# Patient Record
Sex: Female | Born: 1965 | Race: White | Hispanic: No | Marital: Married | Smoking: Never smoker
Health system: Southern US, Community
[De-identification: ages and names within clinical notes are randomized; demographics above are authoritative.]

## PROBLEM LIST (undated history)

## (undated) DIAGNOSIS — K635 Polyp of colon: Secondary | ICD-10-CM

## (undated) DIAGNOSIS — J302 Other seasonal allergic rhinitis: Secondary | ICD-10-CM

## (undated) DIAGNOSIS — N6320 Unspecified lump in the left breast, unspecified quadrant: Secondary | ICD-10-CM

## (undated) DIAGNOSIS — K219 Gastro-esophageal reflux disease without esophagitis: Secondary | ICD-10-CM

## (undated) DIAGNOSIS — K5790 Diverticulosis of intestine, part unspecified, without perforation or abscess without bleeding: Secondary | ICD-10-CM

## (undated) DIAGNOSIS — H9190 Unspecified hearing loss, unspecified ear: Secondary | ICD-10-CM

## (undated) DIAGNOSIS — N3010 Interstitial cystitis (chronic) without hematuria: Secondary | ICD-10-CM

## (undated) DIAGNOSIS — J320 Chronic maxillary sinusitis: Secondary | ICD-10-CM

## (undated) DIAGNOSIS — F32A Depression, unspecified: Secondary | ICD-10-CM

## (undated) DIAGNOSIS — Z8659 Personal history of other mental and behavioral disorders: Secondary | ICD-10-CM

## (undated) DIAGNOSIS — N390 Urinary tract infection, site not specified: Secondary | ICD-10-CM

## (undated) DIAGNOSIS — K5792 Diverticulitis of intestine, part unspecified, without perforation or abscess without bleeding: Secondary | ICD-10-CM

## (undated) DIAGNOSIS — R064 Hyperventilation: Secondary | ICD-10-CM

## (undated) DIAGNOSIS — G43909 Migraine, unspecified, not intractable, without status migrainosus: Secondary | ICD-10-CM

## (undated) DIAGNOSIS — K589 Irritable bowel syndrome without diarrhea: Secondary | ICD-10-CM

## (undated) DIAGNOSIS — F329 Major depressive disorder, single episode, unspecified: Secondary | ICD-10-CM

## (undated) DIAGNOSIS — K5900 Constipation, unspecified: Secondary | ICD-10-CM

## (undated) DIAGNOSIS — K649 Unspecified hemorrhoids: Secondary | ICD-10-CM

## (undated) DIAGNOSIS — A499 Bacterial infection, unspecified: Secondary | ICD-10-CM

## (undated) DIAGNOSIS — R079 Chest pain, unspecified: Secondary | ICD-10-CM

## (undated) DIAGNOSIS — R109 Unspecified abdominal pain: Secondary | ICD-10-CM

## (undated) HISTORY — DX: Interstitial cystitis (chronic) without hematuria: N30.10

## (undated) HISTORY — PX: VAGINAL HYSTERECTOMY: SUR661

## (undated) HISTORY — DX: Chest pain, unspecified: R07.9

## (undated) HISTORY — DX: Diverticulosis of intestine, part unspecified, without perforation or abscess without bleeding: K57.90

## (undated) HISTORY — DX: Migraine, unspecified, not intractable, without status migrainosus: G43.909

## (undated) HISTORY — DX: Depression, unspecified: F32.A

## (undated) HISTORY — PX: LAPAROSCOPY: SHX197

## (undated) HISTORY — DX: Irritable bowel syndrome, unspecified: K58.9

## (undated) HISTORY — DX: Unspecified hemorrhoids: K64.9

## (undated) HISTORY — DX: Unspecified abdominal pain: R10.9

## (undated) HISTORY — DX: Major depressive disorder, single episode, unspecified: F32.9

## (undated) HISTORY — DX: Urinary tract infection, site not specified: N39.0

## (undated) HISTORY — DX: Hyperventilation: R06.4

## (undated) HISTORY — PX: SALPINGECTOMY: SHX328

## (undated) HISTORY — DX: Polyp of colon: K63.5

## (undated) HISTORY — PX: TUBAL LIGATION: SHX77

## (undated) HISTORY — DX: Bacterial infection, unspecified: A49.9

## (undated) HISTORY — DX: Unspecified hearing loss, unspecified ear: H91.90

## (undated) HISTORY — DX: Diverticulitis of intestine, part unspecified, without perforation or abscess without bleeding: K57.92

## (undated) HISTORY — DX: Constipation, unspecified: K59.00

## (undated) HISTORY — DX: Personal history of other mental and behavioral disorders: Z86.59

## (undated) HISTORY — PX: SPINAL FUSION: SHX223

## (undated) HISTORY — DX: Gastro-esophageal reflux disease without esophagitis: K21.9

## (undated) HISTORY — DX: Other seasonal allergic rhinitis: J30.2

## (undated) HISTORY — DX: Chronic maxillary sinusitis: J32.0

---

## 2005-06-11 ENCOUNTER — Encounter: Admission: RE | Admit: 2005-06-11 | Discharge: 2005-09-09 | Payer: Self-pay | Admitting: Neurosurgery

## 2005-07-26 ENCOUNTER — Encounter: Admission: RE | Admit: 2005-07-26 | Discharge: 2005-07-26 | Payer: Self-pay | Admitting: Neurosurgery

## 2005-12-14 ENCOUNTER — Encounter: Payer: Self-pay | Admitting: Cardiology

## 2006-12-08 ENCOUNTER — Inpatient Hospital Stay (HOSPITAL_COMMUNITY): Admission: RE | Admit: 2006-12-08 | Discharge: 2006-12-11 | Payer: Self-pay | Admitting: Orthopaedic Surgery

## 2007-10-23 ENCOUNTER — Ambulatory Visit: Payer: Self-pay | Admitting: Gastroenterology

## 2007-10-23 DIAGNOSIS — K625 Hemorrhage of anus and rectum: Secondary | ICD-10-CM

## 2007-10-24 ENCOUNTER — Ambulatory Visit: Payer: Self-pay | Admitting: Gastroenterology

## 2007-10-24 ENCOUNTER — Telehealth: Payer: Self-pay | Admitting: Gastroenterology

## 2007-10-24 ENCOUNTER — Encounter: Payer: Self-pay | Admitting: Gastroenterology

## 2007-10-24 LAB — CONVERTED CEMR LAB
BUN: 21 mg/dL (ref 6–23)
Basophils Relative: 0.9 % (ref 0.0–3.0)
CO2: 25 meq/L (ref 19–32)
Calcium: 9.6 mg/dL (ref 8.4–10.5)
Chloride: 106 meq/L (ref 96–112)
Creatinine, Ser: 0.9 mg/dL (ref 0.4–1.2)
Eosinophils Relative: 2.3 % (ref 0.0–5.0)
GFR calc Af Amer: 88 mL/min
GFR calc non Af Amer: 73 mL/min
Glucose, Bld: 89 mg/dL (ref 70–99)
Hemoglobin: 14.5 g/dL (ref 12.0–15.0)
IgA: 180 mg/dL (ref 68–378)
Lymphocytes Relative: 33.3 % (ref 12.0–46.0)
MCHC: 34.4 g/dL (ref 30.0–36.0)
Monocytes Relative: 8.3 % (ref 3.0–12.0)
Neutro Abs: 3.1 10*3/uL (ref 1.4–7.7)
RBC: 4.43 M/uL (ref 3.87–5.11)
TSH: 1.75 microintl units/mL (ref 0.35–5.50)
Total Bilirubin: 0.8 mg/dL (ref 0.3–1.2)
WBC: 5.5 10*3/uL (ref 4.5–10.5)

## 2007-10-25 ENCOUNTER — Telehealth: Payer: Self-pay | Admitting: Gastroenterology

## 2007-10-26 ENCOUNTER — Encounter: Payer: Self-pay | Admitting: Gastroenterology

## 2008-05-08 ENCOUNTER — Ambulatory Visit: Payer: Self-pay | Admitting: Gastroenterology

## 2008-05-09 LAB — CONVERTED CEMR LAB
ALT: 16 units/L (ref 0–35)
Albumin: 3.9 g/dL (ref 3.5–5.2)
Total Protein: 7.3 g/dL (ref 6.0–8.3)

## 2008-05-14 ENCOUNTER — Telehealth: Payer: Self-pay | Admitting: Gastroenterology

## 2008-05-14 ENCOUNTER — Ambulatory Visit: Payer: Self-pay | Admitting: Gastroenterology

## 2008-05-14 DIAGNOSIS — R5381 Other malaise: Secondary | ICD-10-CM

## 2008-05-14 DIAGNOSIS — R141 Gas pain: Secondary | ICD-10-CM

## 2008-05-14 DIAGNOSIS — R1084 Generalized abdominal pain: Secondary | ICD-10-CM

## 2008-05-14 DIAGNOSIS — R143 Flatulence: Secondary | ICD-10-CM

## 2008-05-14 DIAGNOSIS — R5383 Other fatigue: Secondary | ICD-10-CM

## 2008-05-14 DIAGNOSIS — K219 Gastro-esophageal reflux disease without esophagitis: Secondary | ICD-10-CM

## 2008-05-14 DIAGNOSIS — R142 Eructation: Secondary | ICD-10-CM

## 2008-05-14 DIAGNOSIS — K589 Irritable bowel syndrome without diarrhea: Secondary | ICD-10-CM

## 2008-05-15 ENCOUNTER — Telehealth: Payer: Self-pay | Admitting: Nurse Practitioner

## 2008-05-15 DIAGNOSIS — K594 Anal spasm: Secondary | ICD-10-CM

## 2008-05-17 LAB — CONVERTED CEMR LAB
Basophils Absolute: 0 10*3/uL (ref 0.0–0.1)
Basophils Relative: 0.5 % (ref 0.0–3.0)
Hemoglobin: 14 g/dL (ref 12.0–15.0)
Lymphocytes Relative: 39.1 % (ref 12.0–46.0)
MCHC: 35.1 g/dL (ref 30.0–36.0)
Monocytes Relative: 6.2 % (ref 3.0–12.0)
Neutro Abs: 3.3 10*3/uL (ref 1.4–7.7)
Neutrophils Relative %: 52.3 % (ref 43.0–77.0)
RBC: 4.09 M/uL (ref 3.87–5.11)
RDW: 13 % (ref 11.5–14.6)
TSH: 1.41 microintl units/mL (ref 0.35–5.50)

## 2008-12-30 ENCOUNTER — Encounter: Payer: Self-pay | Admitting: Cardiology

## 2009-01-02 ENCOUNTER — Encounter: Payer: Self-pay | Admitting: Cardiology

## 2009-01-02 ENCOUNTER — Ambulatory Visit: Payer: Self-pay | Admitting: Cardiology

## 2009-01-02 DIAGNOSIS — R079 Chest pain, unspecified: Secondary | ICD-10-CM

## 2009-01-02 HISTORY — DX: Chest pain, unspecified: R07.9

## 2009-01-07 ENCOUNTER — Encounter: Payer: Self-pay | Admitting: Cardiology

## 2009-01-13 DIAGNOSIS — F411 Generalized anxiety disorder: Secondary | ICD-10-CM | POA: Insufficient documentation

## 2009-01-13 DIAGNOSIS — G43909 Migraine, unspecified, not intractable, without status migrainosus: Secondary | ICD-10-CM | POA: Insufficient documentation

## 2009-01-13 DIAGNOSIS — M549 Dorsalgia, unspecified: Secondary | ICD-10-CM | POA: Insufficient documentation

## 2009-01-14 ENCOUNTER — Encounter: Payer: Self-pay | Admitting: Physician Assistant

## 2009-01-14 ENCOUNTER — Encounter (INDEPENDENT_AMBULATORY_CARE_PROVIDER_SITE_OTHER): Payer: Self-pay | Admitting: *Deleted

## 2009-01-14 ENCOUNTER — Ambulatory Visit: Payer: Self-pay | Admitting: Cardiology

## 2009-01-14 DIAGNOSIS — R079 Chest pain, unspecified: Secondary | ICD-10-CM

## 2009-01-16 ENCOUNTER — Encounter (INDEPENDENT_AMBULATORY_CARE_PROVIDER_SITE_OTHER): Payer: Self-pay | Admitting: *Deleted

## 2009-01-16 ENCOUNTER — Encounter: Payer: Self-pay | Admitting: Cardiology

## 2009-01-21 ENCOUNTER — Inpatient Hospital Stay (HOSPITAL_BASED_OUTPATIENT_CLINIC_OR_DEPARTMENT_OTHER): Admission: RE | Admit: 2009-01-21 | Discharge: 2009-01-21 | Payer: Self-pay | Admitting: Cardiovascular Disease

## 2009-01-21 ENCOUNTER — Ambulatory Visit: Payer: Self-pay | Admitting: Cardiovascular Disease

## 2010-01-05 ENCOUNTER — Ambulatory Visit (HOSPITAL_COMMUNITY)
Admission: RE | Admit: 2010-01-05 | Discharge: 2010-01-05 | Payer: Self-pay | Source: Home / Self Care | Admitting: Obstetrics and Gynecology

## 2010-01-16 ENCOUNTER — Ambulatory Visit (HOSPITAL_COMMUNITY)
Admission: RE | Admit: 2010-01-16 | Discharge: 2010-01-16 | Payer: Self-pay | Source: Home / Self Care | Admitting: Obstetrics and Gynecology

## 2010-04-19 ENCOUNTER — Encounter: Payer: Self-pay | Admitting: Orthopaedic Surgery

## 2010-06-11 LAB — SURGICAL PCR SCREEN: Staphylococcus aureus: NEGATIVE

## 2010-06-11 LAB — CBC
MCV: 96.6 fL (ref 78.0–100.0)
Platelets: 190 10*3/uL (ref 150–400)
RBC: 4.21 MIL/uL (ref 3.87–5.11)
RDW: 13.2 % (ref 11.5–15.5)
WBC: 6.4 10*3/uL (ref 4.0–10.5)

## 2010-08-11 NOTE — Discharge Summary (Signed)
NAMEJASLEEN, [name]              ACCOUNT NO.:  [id number]   MEDICAL RECORD NO.:  [id number]          PATIENT TYPE:  INP   LOCATION:  5010                         FACILITY:  [facility]   PHYSICIAN:  Sharolyn Douglas, M.D.        DATE OF BIRTH:  Jan 26, 1966   DATE OF ADMISSION:  12/08/2006  DATE OF DISCHARGE:                               DISCHARGE SUMMARY   ADMITTING DIAGNOSES:  1. 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  2. 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  3. Anxiety.  4. Asthma.   DISCHARGE DIAGNOSES:  1. Status post L4-5 posterior spinal fusion.  2. Postoperative blood loss anemia.  3. Postoperative hypokalemia.  4. 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  5. 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  6. Anxiety.  7. Asthma.   PROCEDURE:  On September 11, 2008m the patient was taken to the  operating room for L4-5 laminectomy and decompression, as well as  posterior spinal fusion with pedicle screws.   SURGEON:  Max Noel Gerold.   ASSISTANT:  Colleen Mahar PA-C.   ANESTHESIA:  General.   CONSULTANT:  None.   LABS:  Preoperatively, CBC with diff was within normal limits.  Postoperatively, CBC was monitored x2 days, and she did have mild anemia  but otherwise normal.  PT/INR and PTT pre-op were normal.  Complete  metabolic panel pre-op was normal.  Postoperatively, basic metabolic  panel.  She had elevated glucose at 136 and 113 on postoperative day 1  and 2.  Potassium was decreased at 3.4 on postoperative day #2,  otherwise basic metabolic panel was normal.  UA from preop was negative.  Blood typing was B+, antibody screen was negative.  Urine culture showed  no growth.  X-rays were used intraoperatively for localization.  EKG was  not seen on the chart.   BRIEF HISTORY:  The patient is a 45 year old female with chronic  persistent back pain and left lower extremity pain.  She has a large  disk herniation at L4-5, which was impinging the left L5 nerve root.  She has pain in her back and  does extend down the left leg.  Diskogram  did show concordant pain at L4-5 level.  Throat she unfortunately failed  all conservative treatments and was having continued severe pain that  was affecting her activities of daily living and quality of life.  Risks  and benefits of the surgery were discussed with the patient by Dr.  Noel Gerold.  She indicated understanding and opted to proceed.   HOSPITAL COURSE:  On December 08, 2006, the patient was taken to the  operating room for the above-listed procedure.  She tolerated the  procedure well without any intraoperative complications, and she was  transferred to the recovery room in stable condition.   Postoperatively, routine orthopedic spine protocol was followed.  She  progressed along very well.  She did not develop any significant medical  orthopedic complications postoperatively.   Physical therapy and occupational therapy worked with her on a daily  basis.  They worked on back precautions.  They also worked on brace use  and ambulation and  progressive ambulation program.  She did extremely  well with them, was ambulating excess of 300 feet by postoperative day  #2.  She was independent and safe with all the above, prior to  discharge.   By December 11, 2006, the patient had met all orthopedic goals.  She  was medically stable and ready for discharge home.   DISCHARGE/PLAN:  The patient is a 45 year old female status post  posterior spinal fusion doing well.   ACTIVITIES:  Daily ambulation program, brace on for activities, back  precautions at all times, may shower, no lifting greater than 5 pounds,  progressive ambulation program.  Follow-up 2 weeks postoperatively with  Dr. Noel Gerold.   DIET:  Regular home diet.   CONDITION ON DISCHARGE:  Stable, improved.   DISPOSITION:  The patient is being discharged to her home with her  family's assistance.  Family assistance, as well as home health physical  therapy and occupational  therapy.   MEDICATIONS:  Vicodin for pain, Robaxin for muscle spasm, calcium daily,  multivitamin daily, Colace twice daily, laxative as needed and resume  home medications.      Verlin Fester, P.A.      Sharolyn Douglas, M.D.  Electronically Signed    CM/MEDQ  D:  12/11/2006  T:  12/11/2006  Job:  [id number]   cc:   Sharolyn Douglas, M.D.

## 2010-08-11 NOTE — Op Note (Signed)
NAMEDEMETRIC, PARSLOW              ACCOUNT NO.:  [id number]   MEDICAL RECORD NO.:  [id number]          PATIENT TYPE:  INP   LOCATION:  2550                         FACILITY:  [facility]   PHYSICIAN:  Sharolyn Douglas, M.D.        DATE OF BIRTH:  09/28/1965   DATE OF PROCEDURE:  12/08/2006  DATE OF DISCHARGE:                               OPERATIVE REPORT   DIAGNOSES:  1. Lumbar degenerative disk disease L4-5 with chronic back and left      leg pain.  2. 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   PROCEDURES:  1. L4-5 laminectomy with decompression of disk herniation.  2. L4-5 transforaminal lumbar interbody fusion with placement of 10 mm      PEEK cage.  3. Pedicle screw instrumentation L4-5 using the Abbott spine system.  4. Posterior spinal fusion L4-5.  5. Local autogenous bone graft supplemented with 5 mL Grafton      allograft and OP-1 BMP.   SURGEON:  Sharolyn Douglas, MD.   ASSISTANTJill Side Mahar, PA.   ANESTHESIA:  General endotracheal.   ESTIMATED BLOOD LOSS:  300 mL.   COMPLICATIONS:  None.   Needle and sponge count correct and 70.   INDICATIONS:  The patient is a pleasant 45 year old female with chronic  persistent back and left lower extremity pain.  She has a large central  and left-sided disk herniation at L4-5 which displaces the left L5 nerve  root.  She has had a diskogram performed with concordant pain at low  pressure at the L4-5 level.  She has failed all attempts at other  conservative options and now elects to undergo L4-5 decompression and  fusion in hopes of improving her symptoms.  Risks, benefits and  alternatives were reviewed and the patient elected to proceed.   PROCEDURE:  After informed consent, she was taken to the operating room.  She underwent general endotracheal anesthesia without SSEPs.  She was  carefully turned prone onto the Wilson frame.  All bony prominences  padded.  Face and eyes protected at all times.  Back prepped and draped  in usual sterile fashion.  A  midline incision was made from L3 down to  L5.  Dissection was carried sharply through the deep fascia.  Subperiosteal exposure was carried out to the tips of the transverse  process of L4 and L5.  Intraoperative x-ray was taken to confirm the  levels.  Deep retractors were placed.  The approach was somewhat  difficult due to the patient's extreme lordosis.  Pedicle screws were  placed at L4 and L5 using anatomic probing technique.  Each pedicle  starting position was identified using the local anatomy.  The pedicles  were cannulated and then palpated.  There were no breeches.  Each  pedicle was then tapped.  The 6.5 x 45 mm screws were placed in L4 and  6.5 x 40 mm screws were placed at L5.  The screw purchase was good.  We  stimulated each screw using triggered EMGs and there were no deleterious  changes.   At this point, we turned our attention to performing  a laminectomy and  diskectomy.  The interspinous ligament and superior two thirds of the L5  spinous process were removed.  The ligamentum flavum was removed  piecemeal.  A partial facetectomy was completed on the left side.  The  lateral aspect of the thecal sac was identified and the L5 nerve root.  These structures were  then gently mobilized medially.  We identified a  large left-sided and central disk herniation which was displacing the  thecal sac and nerve root dorsally.  The disk space was entered and  using Epstein curettes.  The disk was decompressed back into the  interspace and a subtotal diskectomy was completed.  At this point, we  turned our attention to performing a transforaminal lumbar interbody  fusion in order to further decompress the nerve roots, remove the  painful disk and improve the fusion rate.  The remaining facet joint was  osteotomized.  The exiting and transversing nerve roots were identified  and protected at all times.  Free running EMGs were monitored.  The disk  space was entered and the  cartilaginous endplates were scraped clean  using the curettes and working through the transforaminal window.  Once  we were satisfied with the diskectomy and the disk space preparation,  the disk space was dilated up to 10 mm.  We then packed the disk space  with local bone graft obtained from the laminectomy.  A 10 mm PEEK cage  from Abbott Spine measuring 10 x 26 was packed with OP-1 BMP.  The cage  was placed into the interspace, tamped anteriorly, flipped  longitudinally within the interspace.  We had good distraction.  There  were no changes in the free running EMGs throughout the TLIF procedure.  We then completed posterior spinal fusion by decorticating the  transverse processes L4 and L5 bilaterally.  The remaining bone graft  was mixed with 5 mL of Grafton allograft and packed into the lateral  gutters over the transverse processes of L4 and L5.  We then placed 40  mm titanium rods into the polyaxial screw heads and gentle compression  was applied before shearing off the locking caps. Intraoperative x-rays  were taken which showed the instrumentation in appropriate position at  L4-5.  A deep Hemovac drain was left.  Meticulous hemostasis was  achieved.  The deep fascia was closed with running #1 Vicryl suture.  Subcutaneous layer closed with 0 Vicryl and 2-0 Vicryl followed by a  running 3-0 subcuticular Vicryl suture on the skin edges.  Dermabond was  applied.  Sterile dressing placed.  Patient was turned supine, extubated  without difficulty and transferred to recovery in stable condition.   My assistant, Verlin Fester, PA, was present throughout procedure.  She  assisted me with the positioning, she then helped me with the exposure  using the Cobb elevators and suction.  She worked Systems developer during the decompression, providing retraction  of the neural elements and suction.  She also assisted with the  arthrodesis and the instrumentation and  assisted with wound closure.      Sharolyn Douglas, M.D.  Electronically Signed     MC/MEDQ  D:  12/08/2006  T:  12/09/2006  Job:  [id number]

## 2010-08-11 NOTE — Assessment & Plan Note (Signed)
[facility]                                 ON-CALL NOTE   NAME:Griffin, Ellen                       MRN:          [id number]  DATE:10/23/2007                            DOB:          March 21, 1966    NOTE:  Telephone call; time 6 P.M.  Phone number is [phone]   The patient is beginning a colonoscopy prep tonight with Movi Prep.  She  states that she has only been able to drink one glass of the prep and  she started vomiting.  She strongly prefers proceeding with a pill  preparation.  I advised her that Osmoprep has a warning regarding kidney  damage and even kidney failure on rare occasions and I advised her I  would prefer to use another colon preparation of colon and antiemetic.  She states she understands the risks to Osmoprep and wishes to have that  preparation as she does not feel any other liquid oral preparation will  be tolerable.  I called her in a prescription for Osmoprep #32 with the  standard split dose preparation outlined to the patient over the phone  and encouraged her to push large volumes of clear fluid before, during  and after the evening and morning preparation.     Venita Lick. Russella Dar, MD, [facility]  Electronically Signed    MTS/MedQ  DD: 10/23/2007  DT: 10/24/2007  Job #: [id number]   cc:   Rachael Fee, MD

## 2010-11-23 ENCOUNTER — Ambulatory Visit (HOSPITAL_BASED_OUTPATIENT_CLINIC_OR_DEPARTMENT_OTHER)
Admission: RE | Admit: 2010-11-23 | Discharge: 2010-11-23 | Disposition: A | Discharge status: Home / Self Care | Payer: BC Managed Care – PPO | Source: Ambulatory Visit | Attending: Urology | Admitting: Urology

## 2010-11-23 DIAGNOSIS — IMO0002 Reserved for concepts with insufficient information to code with codable children: Secondary | ICD-10-CM | POA: Insufficient documentation

## 2010-11-23 DIAGNOSIS — N949 Unspecified condition associated with female genital organs and menstrual cycle: Secondary | ICD-10-CM | POA: Insufficient documentation

## 2010-11-23 DIAGNOSIS — R351 Nocturia: Secondary | ICD-10-CM | POA: Insufficient documentation

## 2010-11-23 DIAGNOSIS — Z01812 Encounter for preprocedural laboratory examination: Secondary | ICD-10-CM | POA: Insufficient documentation

## 2010-11-23 LAB — POCT HEMOGLOBIN-HEMACUE: Hemoglobin: 13.8 g/dL (ref 12.0–15.0)

## 2010-12-16 NOTE — Op Note (Signed)
  NAMEBENTLEY, HARALSON              ACCOUNT NO.:  [id number]  MEDICAL RECORD NO.:  [id number]  LOCATION:                                 FACILITY:  PHYSICIAN:  Valetta Fuller, MD    DATE OF BIRTH:  1965/12/22  DATE OF PROCEDURE:  11/23/2010 DATE OF DISCHARGE:                              OPERATIVE REPORT   PREOPERATIVE DIAGNOSES: 1. Pelvic pain. 2. Possible interstitial cystitis.  POSTOPERATIVE DIAGNOSIS: 1. Pelvic pain. 2. Possible interstitial cystitis.  PROCEDURE PERFORMED:  Cystoscopy with hydraulic overdistention of bladder and instillation of Marcaine.  SURGEON:  Valetta Fuller, MD  ANESTHESIA:  General.  INDICATIONS:  Ms. Lasure is 45 years of age.  She was sent to see me initially because of pelvic pain and questionable recurrent bacterial cystitis.  Based on history, it appeared that she had potentially a component of painful bladder syndrome/interstitial cystitis.  She has had constant problems with bladder pressure as well as some bladder overactivity.  She has nocturia 3-4 per evening and discomfort with intercourse.  We initiated her care with some behavioral modification and attempts at antispasmodics.  She was in recently to see one of our PAs and had ongoing symptoms.  Conservative therapy had not improved her situation.  We had previously talked to her about the possibility of cystoscopy with hydraulic overdistention of the bladder for both further diagnostic as well as potential therapeutic benefit.  She wanted to proceed with that and appeared to understand the advantages, disadvantages, potential complications, and risks.  She presents now for the procedure.  TECHNIQUE AND FINDINGS:  The patient was brought to the operating room where she had successful administration of general anesthesia.  She was placed in lithotomy position and prepped and draped in usual manner. She received perioperative ciprofloxacin.  Appropriate surgical time-out was  performed.  There were no obvious clinical abnormalities on vaginal exam.  Cystoscopy revealed unremarkable urethra and bladder.  The patient's bladder was hydraulically over distended to 1 meter of water pressure for 5 minutes.  Capacity was 1000 cc.  She had a few scant areas of some very minimal glomerular hemorrhaging, but overall endoscopically this was fairly unimpressive and there was no clear endoscopic evidence of interstitial cystitis.  No other abnormalities were appreciated.  We did instill 30 cc of Marcaine in her bladder.  We will ultimately wait to see if this procedure has any benefit for her.  If not, then we may not be dealing with interstitial cystitis given the current findings today.     Valetta Fuller, MD     DSG/MEDQ  D:  11/23/2010  T:  11/23/2010  Job:  [id number]  cc:   Guy Sandifer. Henderson Cloud, M.D. Fax: [phone]  Electronically Signed by Barron Alvine M.D. on 12/16/2010 09:30:15 AM

## 2011-01-08 LAB — COMPREHENSIVE METABOLIC PANEL
ALT: 16
AST: 21
Alkaline Phosphatase: 63
CO2: 29
Calcium: 9.8
Chloride: 105
GFR calc Af Amer: >60
GFR calc non Af Amer: >60
Glucose, Bld: 98
Potassium: 3.8
Sodium: 139
Total Bilirubin: 0.6

## 2011-01-08 LAB — URINALYSIS, ROUTINE W REFLEX MICROSCOPIC
Bilirubin Urine: NEGATIVE
Glucose, UA: NEGATIVE
Hgb urine dipstick: NEGATIVE
Protein, ur: NEGATIVE

## 2011-01-08 LAB — PROTIME-INR
INR: 0.9
Prothrombin Time: 12

## 2011-01-08 LAB — CBC
HCT: 30.8 — ABNORMAL LOW
Hemoglobin: 14.6
MCHC: 34.5
MCHC: 34.6
MCV: 93.4
MCV: 94.7
Platelets: 169
Platelets: 184
RBC: 3.25 — ABNORMAL LOW
RBC: 3.44 — ABNORMAL LOW
RBC: 4.51
RDW: 13.3
WBC: 6.4
WBC: 7
WBC: 9.4

## 2011-01-08 LAB — BASIC METABOLIC PANEL
BUN: 10
BUN: 13
CO2: 26
Calcium: 8.5
Chloride: 107
Creatinine, Ser: 0.64
Creatinine, Ser: 0.7
GFR calc Af Amer: >60
Glucose, Bld: 136 — ABNORMAL HIGH

## 2011-01-08 LAB — URINE CULTURE: Culture: NO GROWTH

## 2011-01-08 LAB — ABO/RH: ABO/RH(D): B POS

## 2011-01-08 LAB — DIFFERENTIAL
Basophils Relative: 1
Eosinophils Absolute: 0.1
Eosinophils Relative: 2
Lymphs Abs: 2.2
Neutrophils Relative %: 55

## 2011-01-08 LAB — TYPE AND SCREEN: ABO/RH(D): B POS

## 2011-07-27 ENCOUNTER — Encounter: Payer: Self-pay | Admitting: Gastroenterology

## 2011-08-17 ENCOUNTER — Ambulatory Visit: Payer: BC Managed Care – PPO | Admitting: Gastroenterology

## 2011-10-21 ENCOUNTER — Encounter: Payer: Self-pay | Admitting: *Deleted

## 2011-12-13 ENCOUNTER — Encounter: Payer: Self-pay | Admitting: Gastroenterology

## 2012-01-18 ENCOUNTER — Ambulatory Visit: Payer: BC Managed Care – PPO | Admitting: Gastroenterology

## 2012-02-02 ENCOUNTER — Encounter: Payer: Self-pay | Admitting: Gastroenterology

## 2012-02-22 ENCOUNTER — Ambulatory Visit (INDEPENDENT_AMBULATORY_CARE_PROVIDER_SITE_OTHER): Payer: BC Managed Care – PPO | Admitting: Gastroenterology

## 2012-02-22 ENCOUNTER — Encounter: Payer: Self-pay | Admitting: Gastroenterology

## 2012-02-22 VITALS — BP 122/78 | HR 77 | Ht 63.0 in | Wt 170.2 lb

## 2012-02-22 DIAGNOSIS — R194 Change in bowel habit: Secondary | ICD-10-CM | POA: Insufficient documentation

## 2012-02-22 DIAGNOSIS — K660 Peritoneal adhesions (postprocedural) (postinfection): Secondary | ICD-10-CM

## 2012-02-22 DIAGNOSIS — R198 Other specified symptoms and signs involving the digestive system and abdomen: Secondary | ICD-10-CM

## 2012-02-22 DIAGNOSIS — R141 Gas pain: Secondary | ICD-10-CM | POA: Insufficient documentation

## 2012-02-22 DIAGNOSIS — K922 Gastrointestinal hemorrhage, unspecified: Secondary | ICD-10-CM | POA: Insufficient documentation

## 2012-02-22 DIAGNOSIS — R109 Unspecified abdominal pain: Secondary | ICD-10-CM

## 2012-02-22 MED ORDER — SOD PHOS MONO-SOD PHOS DIBASIC 1.102-0.398 G PO TABS: 1.0000 | ORAL_TABLET | Freq: Once | ORAL | Status: DC

## 2012-02-22 MED ORDER — HYOSCYAMINE SULFATE 0.125 MG SL SUBL: 0.1250 mg | SUBLINGUAL_TABLET | Freq: Two times a day (BID) | SUBLINGUAL | Status: DC

## 2012-02-22 NOTE — Patient Instructions (Addendum)
Please start taking citrucel (orange flavored) powder fiber supplement.  This may cause some bloating at first but that usually goes away. Begin with a small spoonful and work your way up to a large, heaping spoonful daily over a week. Gas ex trial. Take one pill with every meal. You will have labs checked today in the basement lab.  Please head down after you check out with the front desk  (cbc, cmet, tsh) You will be set up for a colonoscopy (MAC sedation in [facility]).  You have signed a waiver to use the Osmo Prep due to the warning associated.

## 2012-02-22 NOTE — Progress Notes (Signed)
02/22/2012 [name] [id number] 02/22/1966   HISTORY OF PRESENT ILLNESS:  Patient presents to the office today with several GI complaints.  She complains of sharp lower abdominal pain that shoots across her lower abdomen.  Occurs randomly; not necessarily associated with eating or movement.  Has woken her from sleep on occasion.  Has alternating constipation and diarrhea with constipation being more prominent.  Says that the caliber of her stool has changed; very thin stools at times.  She bright red blood on the stool and toilet paper at times.  Complains of a lot of bloating on a daily basis.  Denies nausea, vomiting, decreased appetite or weight loss.  Had a colonoscopy in 09/2007, which showed one hyperplastic colonic polyp and external hemorrhoids.  She says that she had surgery by her GYN and was told that her ovaries were attached to her colon.  Says that she had a pelvic ultrasound, which elicited pain in her LLQ and she was told that it was probably her colon causing the pain, so she was instructed to be seen in our office.  Says that her son has IBS since he was a child as well.    Past Medical History  Diagnosis Date  . Seasonal allergies   . Migraines   . IBS (irritable bowel syndrome)   . Urinary tract bacterial infections   . Interstitial cystitis   . Colon polyp   . Chest pain 10.7.2010    stress echo...abnormal  . Hyperventilation   . Maxillary sinusitis   . Depression   . IBS (irritable bowel syndrome)   . Hemorrhoid    Past Surgical History  Procedure Date  . Tubal ligation   . Salpingectomy   . Abdominal hysterectomy   . Spinal fusion   . Laparoscopy     reports that she has quit smoking. Her smoking use included Cigarettes. She has never used smokeless tobacco. She reports that she does not drink alcohol. Her drug history not on file. family history includes COPD in her father; Colon cancer in her paternal aunt; Diabetes in her maternal grandfather;  Irritable bowel syndrome in her son; Ovarian cancer in her cousin; and Prostate cancer in her paternal uncle. Allergies  Allergen Reactions  . Codeine     rash  . Morphine And Related       Outpatient Encounter Prescriptions as of 02/22/2012  Medication Sig Dispense Refill  . ALPRAZolam (XANAX) 0.25 MG tablet Take 0.25 mg by mouth as needed.      Marland Kitchen aspirin-acetaminophen-caffeine (EXCEDRIN MIGRAINE) 250-250-65 MG per tablet Take 1 tablet by mouth as needed.      . metoprolol succinate (TOPROL-XL) 25 MG 24 hr tablet Take 25 mg by mouth daily.      . mometasone (NASONEX) 50 MCG/ACT nasal spray Place 2 sprays into the nose daily.      . rizatriptan (MAXALT) 10 MG tablet Take 10 mg by mouth as needed. May repeat in 2 hours if needed      . [DISCONTINUED] aspirin 325 MG tablet Take 325 mg by mouth daily.      . [DISCONTINUED] chlorpheniramine-pseudoephedrine-acetaminophen (TYLENOL ALLERGY SINUS) 2-30-500 MG per tablet Take 1 tablet by mouth as needed.      . [DISCONTINUED] nitroGLYCERIN (NITROSTAT) 0.4 MG SL tablet Place 0.4 mg under the tongue every 5 (five) minutes as needed.      . [DISCONTINUED] simvastatin (ZOCOR) 20 MG tablet Take 20 mg by mouth every evening.      . [  DISCONTINUED] topiramate (TOPAMAX) 25 MG tablet Take 25 mg by mouth daily.      . [DISCONTINUED] Vitamin D, Ergocalciferol, (DRISDOL) 50000 UNITS CAPS Take 50,000 Units by mouth every 7 (seven) days.         REVIEW OF SYSTEMS  : All other systems reviewed and negative except where noted in the History of Present Illness.   PHYSICAL EXAM: BP 122/78  Pulse 77  Ht 5\' 3"  (1.6 m)  Wt 170 lb 3.2 oz (77.202 kg)  BMI 30.15 kg/m2  SpO2 98% General: Well developed white female in no acute distress Head: Normocephalic and atraumatic Eyes:  sclerae anicteric,conjunctive pink. Ears: Normal auditory acuity Neck: Supple, no masses.  Lungs: Clear throughout to auscultation Heart: Regular rate and rhythm Abdomen: Soft, obese,  non-distended. No masses or hepatomegaly noted. Normal bowel sounds.  Mild diffuse TTP without R/R/G. Rectal: Deferred.  Will be performed at the time of colonoscopy. Musculoskeletal: Symmetrical with no gross deformities  Skin: No lesions on visible extremities Extremities: No edema  Neurological: Alert oriented x 4, grossly nonfocal Cervical Nodes:  No significant cervical adenopathy Psychological:  Alert and cooperative. Normal mood and affect.  Slightly anxious.  ASSESSMENT AND PLAN: -Abdominal pain:  Likely secondary to adhesive disease/scar tissue with probable over-lapping IBS. -LGIB -Change in bowel habits:  Alternating constipation and diarrhea. *Will begin fiber supplement daily. *Colonoscopy for evaluation. *Check CBC, CMP, TSH. *Trial of levsin.   ________________________________________________________________________  [facility] MD note:  I personally examined the patient, reviewed the data and agree with the assessment and plan described above.   Rob Bunting, MD [facility] Pager [phone]

## 2012-02-28 ENCOUNTER — Telehealth: Payer: Self-pay | Admitting: Gastroenterology

## 2012-02-28 NOTE — Telephone Encounter (Signed)
Pt notified to have labs the morning of procedure

## 2012-02-28 NOTE — Telephone Encounter (Signed)
Showed prep to be started Tuesday 02/28/12 and procedure 02/29/12, Wednesday. Pt actually scheduled 02/29/12 but has not prepped and has been eating. Dr. Christella Hartigan does have slots available on Wednesday 03/01/12 and so I moved her to Wednesday 03/01/12 at 3:30 pm. Informed her to be here at 2:30 pm, prep to be started as scheduled Tuesday 5-6 pm, 2nd day of prep to begin at 10:30 am (5 hours prior to procedure) and to stop drinking fluids at 12:30 (3 hours prior to procedure).

## 2012-02-28 NOTE — Telephone Encounter (Signed)
Left message on machine to call back  

## 2012-02-28 NOTE — Telephone Encounter (Signed)
Pt calling to inquire about days and dates on previsit instructions. Dates were incorrect for days of procedure. Pt had made arrangements for procedure to be done on Wednesday 03/01/12 but paperwork states

## 2012-02-29 ENCOUNTER — Other Ambulatory Visit: Payer: BC Managed Care – PPO | Admitting: Gastroenterology

## 2012-03-01 ENCOUNTER — Encounter: Payer: Self-pay | Admitting: Gastroenterology

## 2012-03-01 ENCOUNTER — Ambulatory Visit (AMBULATORY_SURGERY_CENTER): Payer: BC Managed Care – PPO | Admitting: Gastroenterology

## 2012-03-01 ENCOUNTER — Other Ambulatory Visit (INDEPENDENT_AMBULATORY_CARE_PROVIDER_SITE_OTHER): Payer: BC Managed Care – PPO

## 2012-03-01 VITALS — BP 101/64 | HR 59 | Temp 98.5°F | Resp 15 | Ht 63.0 in | Wt 170.0 lb

## 2012-03-01 DIAGNOSIS — K660 Peritoneal adhesions (postprocedural) (postinfection): Secondary | ICD-10-CM

## 2012-03-01 DIAGNOSIS — R198 Other specified symptoms and signs involving the digestive system and abdomen: Secondary | ICD-10-CM

## 2012-03-01 DIAGNOSIS — R194 Change in bowel habit: Secondary | ICD-10-CM

## 2012-03-01 DIAGNOSIS — D126 Benign neoplasm of colon, unspecified: Secondary | ICD-10-CM

## 2012-03-01 DIAGNOSIS — K5730 Diverticulosis of large intestine without perforation or abscess without bleeding: Secondary | ICD-10-CM

## 2012-03-01 LAB — COMPREHENSIVE METABOLIC PANEL
Alkaline Phosphatase: 56 U/L (ref 39–117)
BUN: 6 mg/dL (ref 6–23)
CO2: 29 mEq/L (ref 19–32)
Creatinine, Ser: 0.7 mg/dL (ref 0.4–1.2)
GFR: 91.98 mL/min (ref >60.00)
Glucose, Bld: 93 mg/dL (ref 70–99)
Sodium: 139 mEq/L (ref 135–145)
Total Bilirubin: 0.6 mg/dL (ref 0.3–1.2)
Total Protein: 7.2 g/dL (ref 6.0–8.3)

## 2012-03-01 LAB — CBC WITH DIFFERENTIAL/PLATELET
Eosinophils Relative: 2.4 % (ref 0.0–5.0)
HCT: 41.7 % (ref 36.0–46.0)
Hemoglobin: 14.1 g/dL (ref 12.0–15.0)
Lymphs Abs: 2.2 10*3/uL (ref 0.7–4.0)
MCV: 95.3 fl (ref 78.0–100.0)
Monocytes Absolute: 0.5 10*3/uL (ref 0.1–1.0)
Monocytes Relative: 9.7 % (ref 3.0–12.0)
Neutro Abs: 2.6 10*3/uL (ref 1.4–7.7)
Platelets: 247 10*3/uL (ref 150.0–400.0)
RDW: 13.3 % (ref 11.5–14.6)
WBC: 5.6 10*3/uL (ref 4.5–10.5)

## 2012-03-01 LAB — TSH: TSH: 2.57 u[IU]/mL (ref 0.35–5.50)

## 2012-03-01 MED ORDER — SODIUM CHLORIDE 0.9 % IV SOLN: 500.0000 mL | INTRAVENOUS | Status: DC

## 2012-03-01 NOTE — Patient Instructions (Addendum)
Impressions/Recommendations:  Diverticulosis (handout given) Polyps (handout given)  High Fiber Diet (handout given)  Repeat colonoscopy pending pathology of polyps removed.  YOU HAD AN ENDOSCOPIC PROCEDURE TODAY AT THE [facility]: Refer to the procedure report that was given to you for any specific questions about what was found during the examination.  If the procedure report does not answer your questions, please call your gastroenterologist to clarify.  If you requested that your care partner not be given the details of your procedure findings, then the procedure report has been included in a sealed envelope for you to review at your convenience later.  YOU SHOULD EXPECT: Some feelings of bloating in the abdomen. Passage of more gas than usual.  Walking can help get rid of the air that was put into your GI tract during the procedure and reduce the bloating. If you had a lower endoscopy (such as a colonoscopy or flexible sigmoidoscopy) you may notice spotting of blood in your stool or on the toilet paper. If you underwent a bowel prep for your procedure, then you may not have a normal bowel movement for a few days.  DIET: Your first meal following the procedure should be a light meal and then it is ok to progress to your normal diet.  A half-sandwich or bowl of soup is an example of a good first meal.  Heavy or fried foods are harder to digest and may make you feel nauseous or bloated.  Likewise meals heavy in dairy and vegetables can cause extra gas to form and this can also increase the bloating.  Drink plenty of fluids but you should avoid alcoholic beverages for 24 hours.  ACTIVITY: Your care partner should take you home directly after the procedure.  You should plan to take it easy, moving slowly for the rest of the day.  You can resume normal activity the day after the procedure however you should NOT DRIVE or use heavy machinery for 24 hours (because of the sedation medicines  used during the test).    SYMPTOMS TO REPORT IMMEDIATELY: A gastroenterologist can be reached at any hour.  During normal business hours, 8:30 AM to 5:00 PM Monday through Friday, call [phone].  After hours and on weekends, please call the GI answering service at [phone] who will take a message and have the physician on call contact you.   Following lower endoscopy (colonoscopy or flexible sigmoidoscopy):  Excessive amounts of blood in the stool  Significant tenderness or worsening of abdominal pains  Swelling of the abdomen that is new, acute  Fever of 100F or higher  FOLLOW UP: If any biopsies were taken you will be contacted by phone or by letter within the next 1-3 weeks.  Call your gastroenterologist if you have not heard about the biopsies in 3 weeks.  Our staff will call the home number listed on your records the next business day following your procedure to check on you and address any questions or concerns that you may have at that time regarding the information given to you following your procedure. This is a courtesy call and so if there is no answer at the home number and we have not heard from you through the emergency physician on call, we will assume that you have returned to your regular daily activities without incident.  SIGNATURES/CONFIDENTIALITY: You and/or your care partner have signed paperwork which will be entered into your electronic medical record.  These signatures attest to the fact that  that the information above on your After Visit Summary has been reviewed and is understood.  Full responsibility of the confidentiality of this discharge information lies with you and/or your care-partner.

## 2012-03-01 NOTE — Op Note (Signed)
[facility] [address] [place], [address]   COLONOSCOPY PROCEDURE REPORT  PATIENT: [name], [name]  MR#: [id number] BIRTHDATE: 1965-08-15 , 46  yrs. old GENDER: Female ENDOSCOPIST: Rachael Fee, MD PROCEDURE DATE:  03/01/2012 PROCEDURE:   Colonoscopy with snare polypectomy ASA CLASS:   Class II INDICATIONS:hyperplastic polpy 2009; new change in bowels, minor rectal bleeding. MEDICATIONS: Fentanyl 100 mcg IV, Versed 10 mg IV, and These medications were titrated to patient response per physician's verbal order  DESCRIPTION OF PROCEDURE:   After the risks benefits and alternatives of the procedure were thoroughly explained, informed consent was obtained.  A digital rectal exam revealed no abnormalities of the rectum.   The LB [id number]  endoscope was introduced through the anus and advanced to the cecum, which was identified by both the appendix and ileocecal valve. No adverse events experienced.   The quality of the prep was adequate.  The instrument was then slowly withdrawn as the colon was fully examined.  COLON FINDINGS: There were a few small diverticulum in left colon. There were two small polyps found, removed and sent to pathology. These were 2-63mm across, sessile, removed with cold snare, located in transverse and sigmoid segments.  The examination was otherwise normal.  Retroflexed views revealed no abnormalities. The time to cecum=4 minutes 41 seconds.  Withdrawal time=7 minutes 42 seconds. The scope was withdrawn and the procedure completed. COMPLICATIONS: There were no complications.  ENDOSCOPIC IMPRESSION: There were a few small diverticulum in left colon. There were two small polyps found, removed and sent to pathology. The examination was otherwise normal.  RECOMMENDATIONS: If the polyp(s) removed today are proven to be adenomatous (pre-cancerous) polyps, you will need a repeat colonoscopy in 5 years.  Otherwise you should  continue to follow colorectal cancer screening guidelines for "routine risk" patients with colonoscopy in 10 years.  You will receive a letter within 1-2 weeks with the results of your biopsy as well as final recommendations.  Please call my office if you have not received a letter after 3 weeks. Please start the fiber supplements as discussed in office recently.  eSigned:  Rachael Fee, MD 03/01/2012 3:49 PM

## 2012-03-01 NOTE — Progress Notes (Signed)
Patient did not experience any of the following events: a burn prior to discharge; a fall within the facility; wrong site/side/patient/procedure/implant event; or a hospital transfer or hospital admission upon discharge from the facility. (G8907) Patient did not have preoperative order for IV antibiotic SSI prophylaxis. (G8918)  

## 2012-03-02 ENCOUNTER — Telehealth: Payer: Self-pay | Admitting: *Deleted

## 2012-03-02 NOTE — Telephone Encounter (Signed)
  Follow up Call-  Call back number 03/01/2012  Post procedure Call Back phone  # [phone]  Permission to leave phone message Yes     Patient questions:  Do you have a fever, pain , or abdominal swelling? no Pain Score  0 *  Have you tolerated food without any problems? yes  Have you been able to return to your normal activities? yes  Do you have any questions about your discharge instructions: Diet   no Medications  no Follow up visit  no  Do you have questions or concerns about your Care? yes  Actions: * If pain score is 4 or above: No action needed, pain <4.

## 2012-03-07 ENCOUNTER — Encounter: Payer: Self-pay | Admitting: Gastroenterology

## 2012-03-10 ENCOUNTER — Other Ambulatory Visit: Payer: Self-pay

## 2012-03-10 DIAGNOSIS — E875 Hyperkalemia: Secondary | ICD-10-CM

## 2012-03-16 ENCOUNTER — Telehealth: Payer: Self-pay

## 2012-03-16 NOTE — Telephone Encounter (Signed)
Left message on machine to call back  

## 2012-03-16 NOTE — Telephone Encounter (Signed)
Pt has been notified.

## 2012-03-16 NOTE — Telephone Encounter (Signed)
Message copied by Donata Duff on Thu Mar 16, 2012  8:04 AM ------      Message from: Donata Duff      Created: Fri Mar 10, 2012  4:23 PM       Pt to get labs

## 2014-03-25 ENCOUNTER — Other Ambulatory Visit: Payer: Self-pay | Admitting: Obstetrics and Gynecology

## 2014-03-26 LAB — CYTOLOGY - PAP

## 2014-06-04 ENCOUNTER — Encounter: Payer: Self-pay | Admitting: Gastroenterology

## 2014-06-04 ENCOUNTER — Ambulatory Visit (INDEPENDENT_AMBULATORY_CARE_PROVIDER_SITE_OTHER): Payer: BC Managed Care – PPO | Admitting: Gastroenterology

## 2014-06-04 VITALS — BP 124/76 | HR 68 | Ht 63.0 in | Wt 166.0 lb

## 2014-06-04 DIAGNOSIS — K5900 Constipation, unspecified: Secondary | ICD-10-CM

## 2014-06-04 NOTE — Patient Instructions (Addendum)
Please start taking citrucel (orange flavored) powder fiber supplement.  This may cause some bloating at first but that usually goes away. Begin with a small spoonful and work your way up to a large, heaping spoonful daily over a week. Call to report on your response in 4 weeks. If no improvement, then would consider repeating colonoscopy sooner. We have given you a work note for today.

## 2014-06-04 NOTE — Progress Notes (Signed)
Review of pertinent gastrointestinal problems: 1. Precancerous colon polyp: colonsocopy Dr. Ardis Hughs 02/2012 done for change in bowels, minor bleeding found 31mm SSA, recommended recall at 5 year interval.  Also noted diverticulosis. 2009 Colonsocopy Dr. Ardis Hughs found small polyp, was HP (that colonsocopy done for minor rectal bleeding, intermittent abd cramps)     HPI: This is a  very pleasant 49 year old woman whom I last saw 2-3 years ago.  Having left sided abdominal pains.  Stools have been much thinner than usual.  Pains in her side have been worst.  Can have rectal spasms.    Normally constipated, has BM every 3-4 days only.  Usually with a lot of straining.   No overt bleeding, he tells me she is actually gained weight in last year.    Past Medical History  Diagnosis Date  . Seasonal allergies   . Migraines   . IBS (irritable bowel syndrome)   . Urinary tract bacterial infections   . Interstitial cystitis   . Colon polyp   . Chest pain 10.7.2010    stress echo...abnormal  . Hyperventilation   . Maxillary sinusitis   . Depression   . IBS (irritable bowel syndrome)   . Hemorrhoid     Past Surgical History  Procedure Laterality Date  . Tubal ligation    . Salpingectomy    . Abdominal hysterectomy    . Spinal fusion    . Laparoscopy      Current Outpatient Prescriptions  Medication Sig Dispense Refill  . aspirin-acetaminophen-caffeine (EXCEDRIN MIGRAINE) 250-250-65 MG per tablet Take 1 tablet by mouth as needed.    . mometasone (NASONEX) 50 MCG/ACT nasal spray Place 2 sprays into the nose daily.    . rizatriptan (MAXALT) 10 MG tablet Take 10 mg by mouth as needed. May repeat in 2 hours if needed     No current facility-administered medications for this visit.    Allergies as of 06/04/2014 - Review Complete 06/04/2014  Allergen Reaction Noted  . Codeine  02/22/2012  . Depo-medrol [methylprednisolone acetate]  03/01/2012  . Morphine and related  02/22/2012     Family History  Problem Relation Age of Onset  . Colon cancer Paternal Aunt     x2, cousin  . COPD Father   . Diabetes Maternal Grandfather     daughter  . Prostate cancer Paternal Uncle   . Ovarian cancer Cousin   . Irritable bowel syndrome Son     History   Social History  . Marital Status: Married    Spouse Name: N/A  . Number of Children: 3  . Years of Education: N/A   Occupational History  .     Social History Main Topics  . Smoking status: Former Smoker    Types: Cigarettes  . Smokeless tobacco: Never Used  . Alcohol Use: No  . Drug Use: Not on file  . Sexual Activity: Not on file   Other Topics Concern  . Not on file   Social History Narrative   About 2 caffeinated beverages daily      Physical Exam: BP 124/76 mmHg  Pulse 68  Ht 5\' 3"  (1.6 m)  Wt 166 lb (75.297 kg)  BMI 29.41 kg/m2 Constitutional: generally well-appearing Psychiatric: alert and oriented x3 Abdomen: soft, nontender, nondistended, no obvious ascites, no peritoneal signs, normal bowel sounds     Assessment and plan: 49 y.o. female with chronic constipation, abdominal pains, bloating personal history of adenomatous colon polyp  First her precancerous polyp was removed  less than 3 years ago at that time I recommended a 5 year recall. Her current bowel habits are chronic, I think she is most bothered by constipation and this is causing some bloating and abdominal discomforts. I recommended she try fiber supper on a daily basis. She will call back in 4-6 weeks to report on her response to daily fiber supplements. She has not noticed a definite improvement in her overall bowels her discomforts than perhaps we would proceed with repeat colonoscopy sooner than her previously scheduled recall at 5 year interval.

## 2014-06-24 ENCOUNTER — Telehealth: Payer: Self-pay

## 2014-06-24 NOTE — Telephone Encounter (Signed)
Patient says MMM suppose to be setting her up an MRI

## 2014-06-24 NOTE — Telephone Encounter (Signed)
This is a wc encounter this message will be closed and handled on wx chart.

## 2014-07-03 ENCOUNTER — Telehealth: Payer: Self-pay | Admitting: Nurse Practitioner

## 2014-07-03 NOTE — Telephone Encounter (Signed)
Appointment scheduled to review with MMM on Thursday through wc

## 2014-07-04 ENCOUNTER — Encounter: Payer: Self-pay | Admitting: Nurse Practitioner

## 2014-08-05 ENCOUNTER — Telehealth: Payer: Self-pay | Admitting: Gastroenterology

## 2014-08-05 NOTE — Telephone Encounter (Signed)
Pt having an 8/10 cramping abd pain waxes and wanes about every 30 min., no fever, no nausea, passing some gas.  Very small hard stool, last BM this morning. No blood.  Last normal bowel movement Thursday.  Has a history of constipation.  Has been taking citrucel daily.  Not taking any miralax.  Sitting makes it worse.  She did injure her back and has been on steroids, last dose was 4-5 days ago.  Took 1-2 narcotics for pain.  Has a history of diverticulosis.  Please advise.  Pt would like a call back at [phone]

## 2014-08-06 MED ORDER — METRONIDAZOLE 250 MG PO TABS: 250.0000 mg | ORAL_TABLET | Freq: Three times a day (TID) | ORAL | Status: DC

## 2014-08-06 MED ORDER — CIPROFLOXACIN HCL 500 MG PO TABS: 500.0000 mg | ORAL_TABLET | Freq: Two times a day (BID) | ORAL | Status: DC

## 2014-08-06 NOTE — Telephone Encounter (Signed)
I called the number listed, left VM asking her to call back to discuss.

## 2014-08-06 NOTE — Telephone Encounter (Signed)
Pt returning call.  [phone].  She said she was sorry she missed the call.

## 2014-08-06 NOTE — Telephone Encounter (Signed)
Having pains in LLQ with altered bowels (more constipated). Tender on LLQ when she pushes. Chills but no fever. Has diverticulosis on colonoscopy 2 years ago.  We treat presumptively as diverticulitis and she will call back in 2-3 days to report on her response.

## 2015-01-21 ENCOUNTER — Telehealth: Payer: Self-pay | Admitting: Gastroenterology

## 2015-01-21 NOTE — Telephone Encounter (Signed)
Left message on machine to call back  

## 2015-01-28 NOTE — Telephone Encounter (Signed)
Several messages have been left for the pt to return call if she is still experiencing symptoms.  I have not received a response.  I will await further communication from the pt.

## 2015-04-11 ENCOUNTER — Encounter: Payer: Self-pay | Admitting: Gastroenterology

## 2015-04-11 NOTE — Telephone Encounter (Signed)
A user error has taken place.

## 2015-04-14 ENCOUNTER — Other Ambulatory Visit (INDEPENDENT_AMBULATORY_CARE_PROVIDER_SITE_OTHER): Payer: BC Managed Care – PPO

## 2015-04-14 ENCOUNTER — Ambulatory Visit (INDEPENDENT_AMBULATORY_CARE_PROVIDER_SITE_OTHER): Payer: BC Managed Care – PPO | Admitting: Gastroenterology

## 2015-04-14 ENCOUNTER — Encounter: Payer: Self-pay | Admitting: Gastroenterology

## 2015-04-14 VITALS — BP 114/70 | HR 64 | Ht 62.25 in | Wt 169.5 lb

## 2015-04-14 DIAGNOSIS — R1032 Left lower quadrant pain: Secondary | ICD-10-CM

## 2015-04-14 LAB — COMPREHENSIVE METABOLIC PANEL
ALT: 13 U/L (ref 0–35)
AST: 18 U/L (ref 0–37)
Albumin: 4.1 g/dL (ref 3.5–5.2)
Alkaline Phosphatase: 65 U/L (ref 39–117)
BILIRUBIN TOTAL: 0.3 mg/dL (ref 0.2–1.2)
BUN: 19 mg/dL (ref 6–23)
CO2: 23 meq/L (ref 19–32)
Calcium: 9.4 mg/dL (ref 8.4–10.5)
Chloride: 107 mEq/L (ref 96–112)
Creatinine, Ser: 0.87 mg/dL (ref 0.40–1.20)
GFR: 72.99 mL/min (ref >60.00)
GLUCOSE: 95 mg/dL (ref 70–99)
Potassium: 4.1 mEq/L (ref 3.5–5.1)
SODIUM: 143 meq/L (ref 135–145)
Total Protein: 6.8 g/dL (ref 6.0–8.3)

## 2015-04-14 LAB — CBC WITH DIFFERENTIAL/PLATELET
BASOS ABS: 0 10*3/uL (ref 0.0–0.1)
Basophils Relative: 0.6 % (ref 0.0–3.0)
EOS ABS: 0.2 10*3/uL (ref 0.0–0.7)
Eosinophils Relative: 3.8 % (ref 0.0–5.0)
HCT: 39.5 % (ref 36.0–46.0)
HEMOGLOBIN: 13.3 g/dL (ref 12.0–15.0)
Lymphocytes Relative: 43 % (ref 12.0–46.0)
Lymphs Abs: 2.2 10*3/uL (ref 0.7–4.0)
MCHC: 33.7 g/dL (ref 30.0–36.0)
MCV: 93.1 fl (ref 78.0–100.0)
Monocytes Absolute: 0.5 10*3/uL (ref 0.1–1.0)
Monocytes Relative: 9.1 % (ref 3.0–12.0)
NEUTROS PCT: 43.5 % (ref 43.0–77.0)
Neutro Abs: 2.2 10*3/uL (ref 1.4–7.7)
Platelets: 205 10*3/uL (ref 150.0–400.0)
RBC: 4.24 Mil/uL (ref 3.87–5.11)
RDW: 13.7 % (ref 11.5–15.5)
WBC: 5.1 10*3/uL (ref 4.0–10.5)

## 2015-04-14 NOTE — Progress Notes (Signed)
Review of pertinent gastrointestinal problems: 1. Precancerous colon polyp: colonsocopy Dr. Ardis Hughs 02/2012 done for change in bowels, minor bleeding found 81mm SSA, recommended recall at 5 year interval. Also noted diverticulosis. 2009 Colonsocopy Dr. Ardis Hughs found small polyp, was HP (that colonsocopy done for minor rectal bleeding, intermittent abd cramps)    HPI: This is a    very pleasant 50 year old woman my last saw about 10 months ago.  Chief complaint is continued left lower quadrant pain, abnormal bowel habits  llq pains are continuing.  Spasming.  The pain happens nearly every day. Will move her bowels, but only small pebbles.  No overt GI bleeding  miralax doesn't seem to be helping.  Bloating is terrible.    She has a lot of stresses in her life seems to be an anxious person overall.  She was treated empirically for diverticulitis several months ago   Past Medical History  Diagnosis Date  . Seasonal allergies   . Migraines   . IBS (irritable bowel syndrome)   . Urinary tract bacterial infections   . Interstitial cystitis   . Colon polyp   . Chest pain 10.7.2010    stress echo...abnormal  . Hyperventilation   . Maxillary sinusitis   . Depression   . IBS (irritable bowel syndrome)   . Hemorrhoid     Past Surgical History  Procedure Laterality Date  . Tubal ligation    . Salpingectomy    . Abdominal hysterectomy    . Spinal fusion    . Laparoscopy      Current Outpatient Prescriptions  Medication Sig Dispense Refill  . aspirin-acetaminophen-caffeine (EXCEDRIN MIGRAINE) 250-250-65 MG per tablet Take 1 tablet by mouth as needed.    Marland Kitchen estradiol (VIVELLE-DOT) 0.05 MG/24HR patch Place 1 patch onto the skin 2 (two) times a week.    . mometasone (NASONEX) 50 MCG/ACT nasal spray Place 2 sprays into the nose daily.    . rizatriptan (MAXALT) 10 MG tablet Take 10 mg by mouth as needed. May repeat in 2 hours if needed    . Vitamin D, Ergocalciferol, (DRISDOL) 50000  units CAPS capsule Take 50,000 Units by mouth every 7 (seven) days.     No current facility-administered medications for this visit.    Allergies as of 04/14/2015 - Review Complete 04/14/2015  Allergen Reaction Noted  . Codeine  02/22/2012  . Depo-medrol [methylprednisolone acetate]  03/01/2012  . Morphine and related  02/22/2012    Family History  Problem Relation Age of Onset  . Colon cancer Paternal Aunt     x2, cousin  . COPD Father   . Diabetes Maternal Grandfather     daughter  . Prostate cancer Paternal Uncle   . Ovarian cancer Cousin   . Irritable bowel syndrome Son     Social History   Social History  . Marital Status: Married    Spouse Name: N/A  . Number of Children: 3  . Years of Education: N/A   Occupational History  .     Social History Main Topics  . Smoking status: Former Smoker    Types: Cigarettes  . Smokeless tobacco: Never Used  . Alcohol Use: No  . Drug Use: Not on file  . Sexual Activity: Not on file   Other Topics Concern  . Not on file   Social History Narrative   About 2 caffeinated beverages daily     Physical Exam: BP 114/70 mmHg  Pulse 64  Ht 5' 2.25" (1.581 m)  Wt  169 lb 8 oz (76.885 kg)  BMI 30.76 kg/m2 Constitutional: generally well-appearing Psychiatric: alert and oriented x3 Abdomen: soft, mildly tender left lower quadrant, nondistended, no obvious ascites, no peritoneal signs, normal bowel sounds   Assessment and plan: 50 y.o. female with left lower quadrant discomfort, abnormal bowel habits  She had a colonoscopy 2013 so it seems unlikely she has neoplastic cause of her symptoms. Higher enlist possibilities is chronic diverticular changes, IBS, perhaps intra-abdominal scar tissue related to previous gynecologic surgery. I recommended we start with lab tests including CBC, complete metabolic profile as well as CT scan of her abdomen and pelvis. If no clear etiology of her symptoms are noted that she will likely need a  repeat colonoscopy.   Owens Loffler, MD [facility] 04/14/2015, 9:08 AM

## 2015-04-14 NOTE — Patient Instructions (Addendum)
You will be set up for a CT scan of abdomen and pelvis with IV and oral contrast (LLQ pain).  You have been scheduled for a CT scan of the abdomen and pelvis at [facility] ([address]---this is in the same building as Press photographer).   You are scheduled on 04/16/15 at 1130 am. You should arrive 15 minutes prior to your appointment time for registration. Please follow the written instructions below on the day of your exam:  WARNING: IF YOU ARE ALLERGIC TO IODINE/X-RAY DYE, PLEASE NOTIFY RADIOLOGY IMMEDIATELY AT [phone]! YOU WILL BE GIVEN A 13 HOUR PREMEDICATION PREP.  1) Do not eat or drink anything after 730 am (4 hours prior to your test) 2) You have been given 2 bottles of oral contrast to drink. The solution may taste better if refrigerated, but do NOT add ice or any other liquid to this solution. Shake well before drinking.    Drink 1 bottle of contrast @ 930 am (2 hours prior to your exam)  Drink 1 bottle of contrast @ 1030 am (1 hour prior to your exam)  You may take any medications as prescribed with a small amount of water except for the following: Metformin, Glucophage, Glucovance, Avandamet, Riomet, Fortamet, Actoplus Met, Janumet, Glumetza or Metaglip. The above medications must be held the day of the exam AND 48 hours after the exam.  The purpose of you drinking the oral contrast is to aid in the visualization of your intestinal tract. The contrast solution may cause some diarrhea. Before your exam is started, you will be given a small amount of fluid to drink. Depending on your individual set of symptoms, you may also receive an intravenous injection of x-ray contrast/dye. Plan on being at [facility] for 30 minutes or longer, depending on the type of exam you are having performed.  This test typically takes 30-45 minutes to complete.  If you have any questions regarding your exam or if you need to reschedule, you may call the CT department at  [phone] between the hours of 8:00 am and 5:00 pm, Monday-Friday.  ________________________________________________________________________  [name] will have labs checked today in the basement lab.  Please head down after you check out with the front desk  (cbc, cmet). If no clear answer from above, then colonoscopy.

## 2015-04-16 ENCOUNTER — Telehealth: Payer: Self-pay | Admitting: Gastroenterology

## 2015-04-16 ENCOUNTER — Ambulatory Visit (INDEPENDENT_AMBULATORY_CARE_PROVIDER_SITE_OTHER)
Admission: RE | Admit: 2015-04-16 | Discharge: 2015-04-16 | Disposition: A | Discharge status: Home / Self Care | Payer: BC Managed Care – PPO | Source: Ambulatory Visit | Attending: Gastroenterology | Admitting: Gastroenterology

## 2015-04-16 DIAGNOSIS — R1032 Left lower quadrant pain: Secondary | ICD-10-CM | POA: Diagnosis not present

## 2015-04-16 MED ORDER — IOHEXOL 300 MG/ML  SOLN
100.0000 mL | Freq: Once | INTRAMUSCULAR | Status: AC | PRN
Start: 2015-04-16 — End: 2015-04-16
  Administered 2015-04-16: 100 mL via INTRAVENOUS

## 2015-04-17 NOTE — Telephone Encounter (Signed)
Please call the patient. CT scan does not show clear cause of LLQ pain, constipation and so she needs colonoscoy to continue the workup. Also, CT showed thickened esophagus, this is probably related to reflux but she needs EGD at the same time as colonoscoyp to be certain.

## 2015-04-17 NOTE — Telephone Encounter (Signed)
Pt has been scheduled for previsit and colon she is aware

## 2015-05-19 ENCOUNTER — Telehealth: Payer: Self-pay | Admitting: Gastroenterology

## 2015-05-19 NOTE — Telephone Encounter (Signed)
Pt called back and states she does want to have both procedures.  She will come in for previsit.

## 2015-05-20 ENCOUNTER — Telehealth: Payer: Self-pay

## 2015-05-20 ENCOUNTER — Ambulatory Visit (AMBULATORY_SURGERY_CENTER): Payer: Self-pay

## 2015-05-20 VITALS — Ht 62.5 in | Wt 166.2 lb

## 2015-05-20 DIAGNOSIS — Z86010 Personal history of colonic polyps: Secondary | ICD-10-CM

## 2015-05-20 DIAGNOSIS — K219 Gastro-esophageal reflux disease without esophagitis: Secondary | ICD-10-CM

## 2015-05-20 DIAGNOSIS — Z800 Family history of malignant neoplasm of digestive organs: Secondary | ICD-10-CM

## 2015-05-20 NOTE — Progress Notes (Signed)
Per pt, no allergies to soy or egg products.Pt not taking any weight loss meds or using  O2 at home. 

## 2015-05-27 ENCOUNTER — Ambulatory Visit (AMBULATORY_SURGERY_CENTER): Payer: BC Managed Care – PPO | Admitting: Gastroenterology

## 2015-05-27 ENCOUNTER — Encounter: Payer: Self-pay | Admitting: Gastroenterology

## 2015-05-27 VITALS — BP 122/61 | HR 56 | Temp 98.6°F | Resp 10 | Ht 62.5 in | Wt 166.0 lb

## 2015-05-27 DIAGNOSIS — Z860100 Personal history of colon polyps, unspecified: Secondary | ICD-10-CM

## 2015-05-27 DIAGNOSIS — Z800 Family history of malignant neoplasm of digestive organs: Secondary | ICD-10-CM

## 2015-05-27 DIAGNOSIS — K219 Gastro-esophageal reflux disease without esophagitis: Secondary | ICD-10-CM

## 2015-05-27 MED ORDER — LINACLOTIDE 145 MCG PO CAPS: 145.0000 ug | ORAL_CAPSULE | Freq: Every day | ORAL | Status: DC

## 2015-05-27 MED ORDER — SODIUM CHLORIDE 0.9 % IV SOLN: 500.0000 mL | INTRAVENOUS | Status: DC

## 2015-05-27 MED ORDER — OMEPRAZOLE 20 MG PO CPDR: 20.0000 mg | DELAYED_RELEASE_CAPSULE | Freq: Every day | ORAL | Status: DC

## 2015-05-27 NOTE — Patient Instructions (Signed)

## 2015-05-27 NOTE — Op Note (Signed)
[place]  [address]. [place], [address]   ENDOSCOPY PROCEDURE REPORT  PATIENT: Ellen, Griffin  MR#: [id number] BIRTHDATE: 1965/04/08 , 49  yrs. old GENDER: female ENDOSCOPIST: Milus Banister, MD PROCEDURE DATE:  05/27/2015 PROCEDURE:  EGD, diagnostic ASA CLASS:     Class II INDICATIONS:  abnormal (thickened) esophagus on recent CT scan. MEDICATIONS: Monitored anesthesia care, Residual sedation present, and Propofol 100 mg IV TOPICAL ANESTHETIC: none  DESCRIPTION OF PROCEDURE: After the risks benefits and alternatives of the procedure were thoroughly explained, informed consent was obtained.  The LB [id number] endoscope was introduced through the mouth and advanced to the second portion of the duodenum , Without limitations.  The instrument was slowly withdrawn as the mucosa was fully examined.   EXAM: The esophagus and gastroesophageal junction were completely normal in appearance.  The stomach was entered and closely examined.The antrum, angularis, and lesser curvature were well visualized, including a retroflexed view of the cardia and fundus. The stomach wall was normally distensable.  The scope passed easily through the pylorus into the duodenum.  Retroflexed views revealed no abnormalities.     The scope was then withdrawn from the patient and the procedure completed.  COMPLICATIONS: There were no immediate complications.  ENDOSCOPIC IMPRESSION: Normal appearing esophagus and GE junction, the stomach was well visualized and normal in appearance, normal appearing duodenum  RECOMMENDATIONS: Please start once daily OTC omeprazole (20mg  pill), one pill 20-30 min prior to breakfast meal daily.   eSigned:  Milus Banister, MD 05/27/2015 1:44 PM    CC: Monico Blitz, MD

## 2015-05-27 NOTE — Op Note (Signed)
[facility]  [employer] [place], [id number]   COLONOSCOPY PROCEDURE REPORT  PATIENT: Ellen, Griffin  MR#: [id number] BIRTHDATE: 03/14/1966 , 49  yrs. old GENDER: female ENDOSCOPIST: Milus Banister, MD PROCEDURE DATE:  05/27/2015 PROCEDURE:   Colonoscopy, diagnostic First Screening Colonoscopy - Avg.  risk and is 50 yrs.  old or older - No.  Prior Negative Screening - Now for repeat screening. N/A  History of Adenoma - Now for follow-up colonoscopy & has been > or = to 3 yrs.  Yes hx of adenoma.  Has been 3 or more years since last colonoscopy.  Recommend repeat exam, <10 yrs? No ASA CLASS:   Class II INDICATIONS:Precancerous colon polyp: colonsocopy Dr.  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  Also noted diverticulosis. 2009 Colonsocopy Dr.  Ardis Hughs found small polyp, was HP (that colonsocopy done for minor rectal bleeding, intermittent abd cramps). 2017 LLQ abd pain and constipation, CT unrevealing MEDICATIONS: Monitored anesthesia care and Propofol 250 mg IV  DESCRIPTION OF PROCEDURE:   After the risks benefits and alternatives of the procedure were thoroughly explained, informed consent was obtained.  The digital rectal exam revealed no abnormalities of the rectum.   The LB [id number]  endoscope was introduced through the anus and advanced to the cecum, which was identified by both the appendix and ileocecal valve. No adverse events experienced.   The quality of the prep was excellent.  The instrument was then slowly withdrawn as the colon was fully examined. Estimated blood loss is zero unless otherwise noted in this procedure report.    COLON FINDINGS: There was moderate diverticulosis noted in the left colon with associated luminal narrowing and muscular hypertrophy. The examination was otherwise normal.  Retroflexed views revealed no abnormalities. The time to cecum =  2.5 Withdrawal time = 6.8 The scope was withdrawn and the procedure completed. COMPLICATIONS: There were no immediate complications.  ENDOSCOPIC IMPRESSION: 1.   There was moderate diverticulosis noted in the left colon 2.   The examination was otherwise normal  RECOMMENDATIONS: Trial of linzess, called into your pharmacy today. Repeat colonoscopy in 10 years for routine risk colon cancer screening.  eSigned:  Milus Banister, MD 05/27/2015 1:42 PM   cc: Monico Blitz, MD

## 2015-05-27 NOTE — Progress Notes (Signed)
To PACU Pt awake and alert. Report to RN 

## 2015-05-28 ENCOUNTER — Telehealth: Payer: Self-pay | Admitting: *Deleted

## 2015-05-28 NOTE — Telephone Encounter (Signed)
  Follow up Call-  Call back number 05/27/2015  Post procedure Call Back phone  # [phone]  Permission to leave phone message Yes    [facility]

## 2015-12-18 ENCOUNTER — Telehealth: Payer: Self-pay | Admitting: Gastroenterology

## 2015-12-18 NOTE — Telephone Encounter (Signed)
I agree, thanks!

## 2015-12-18 NOTE — Telephone Encounter (Signed)
abd pain in the center of the abdomen, just under the breast bone, bloating, nausea, no rectal bleeding, some pencil size stools.  No fever.  X 3 weeks.  Pt takes  Omeprazole 20 mg daily, several hours before breakfast.  Pt has a history of diverticulosis, takes linzess 2 times weekly it causes diarrhea.  Hx of GERD.  Dr Ardis Hughs, She was advised that it does not sound like diverticulitis but sounds like GERD.  Pt advised to increase omeprazole to twice daily and given reflux precautions. She was also scheduled for  03/09/16 at 11 am.  Please advise further recommendations.

## 2016-03-09 ENCOUNTER — Ambulatory Visit: Payer: BC Managed Care – PPO | Admitting: Gastroenterology

## 2016-05-24 ENCOUNTER — Other Ambulatory Visit: Payer: Self-pay | Admitting: Gastroenterology

## 2016-07-07 ENCOUNTER — Encounter: Payer: Self-pay | Admitting: Gastroenterology

## 2016-07-07 ENCOUNTER — Other Ambulatory Visit: Payer: Self-pay | Admitting: Gastroenterology

## 2016-07-07 ENCOUNTER — Ambulatory Visit (INDEPENDENT_AMBULATORY_CARE_PROVIDER_SITE_OTHER): Payer: BC Managed Care – PPO | Admitting: Gastroenterology

## 2016-07-07 ENCOUNTER — Other Ambulatory Visit (INDEPENDENT_AMBULATORY_CARE_PROVIDER_SITE_OTHER): Payer: BC Managed Care – PPO

## 2016-07-07 VITALS — BP 110/70 | HR 72 | Ht 62.0 in | Wt 126.0 lb

## 2016-07-07 DIAGNOSIS — R140 Abdominal distension (gaseous): Secondary | ICD-10-CM | POA: Diagnosis not present

## 2016-07-07 DIAGNOSIS — R1084 Generalized abdominal pain: Secondary | ICD-10-CM

## 2016-07-07 DIAGNOSIS — K5900 Constipation, unspecified: Secondary | ICD-10-CM

## 2016-07-07 LAB — IGA: IgA: 164 mg/dL (ref 68–378)

## 2016-07-07 MED ORDER — DICYCLOMINE HCL 10 MG PO CAPS: 10.0000 mg | ORAL_CAPSULE | Freq: Two times a day (BID) | ORAL | 2 refills | Status: DC

## 2016-07-07 MED ORDER — LINACLOTIDE 72 MCG PO CAPS: 72.0000 ug | ORAL_CAPSULE | Freq: Every day | ORAL | 0 refills | Status: DC

## 2016-07-07 NOTE — Progress Notes (Signed)
I agree with the above note, plan 

## 2016-07-07 NOTE — Patient Instructions (Addendum)
Your physician has requested that you go to the basement for lab work before leaving today.  Normal BMI (Body Mass Index- based on height and weight) is between 23 and 30. Your BMI today is Body mass index is 23.05 kg/m. Marland Kitchen Please consider follow up  regarding your BMI with your Primary Care Provider.  Take IBgard as directed.  Probiotic: Florastor or VSL #3  We have given you a handout on a lactose free diet.  Thank you

## 2016-07-07 NOTE — Progress Notes (Signed)
07/07/2016 [name] [id number] 1965/06/27   HISTORY OF PRESENT ILLNESS:  This is a 51 year old female who is known to Dr. Ardis Hughs. She has recurrent/chronic complaints of abdominal pain, bloating, constipation. She complains of diffuse abdominal pain, but sometimes localized to the left side. She says that her abdomen becomes very bloated after eating. She knows that particularly lactose-containing products and certain vegetables, etc. cause her symptoms. Sometimes she has burning/stabbing pain down her left side. She says she had an episode of that type of pain about 3 days ago. Now she just has some diffuse soreness and mild discomfort in her left lower quadrant. She was given Linzess 145 g to take daily, but says that she does not use on a regular basis because she cannot take it when she has to work. Sometimes when she takes it randomly it works very well and causes diarrhea, but then other times she does not feel like it helps. She has some degree of symptoms on a daily basis.  She had the same complaints at her last visit with Dr. Ardis Hughs that she has today. CT scan at that time did not show any evidence of diverticulitis. Last colonoscopy was February 2017 at which time she was found have moderate diverticulosis in the left colon. EGD at that time as well was normal.    Past Medical History:  Diagnosis Date  . Abdominal pain   . Chest pain 10.7.2010   stress echo...abnormal  . Colon polyp   . Constipation   . Depression   . GERD (gastroesophageal reflux disease)   . Hemorrhoid   . History of panic attacks   . HOH (hard of hearing)    left ear  . Hyperventilation   . IBS (irritable bowel syndrome)   . Interstitial cystitis   . Maxillary sinusitis   . Migraines   . Seasonal allergies   . Urinary tract bacterial infections    Past Surgical History:  Procedure Laterality Date  . LAPAROSCOPY     abd  . SALPINGECTOMY    . SPINAL FUSION    . TUBAL LIGATION    .  VAGINAL HYSTERECTOMY      reports that she has never smoked. She has never used smokeless tobacco. She reports that she does not drink alcohol or use drugs. family history includes COPD in her father; Colon cancer in her cousin and paternal aunt; Diabetes in her maternal grandfather; Irritable bowel syndrome in her son; Leukemia in her cousin; Ovarian cancer in her cousin; Prostate cancer in her paternal uncle. Allergies  Allergen Reactions  . Morphine And Related     Rash,SOB,whelts  . Codeine     rash  . Depo-Medrol [Methylprednisolone Acetate]     Rash, anxiety      Outpatient Encounter Prescriptions as of 07/07/2016  Medication Sig  . aspirin-acetaminophen-caffeine (EXCEDRIN MIGRAINE) 629-528-41 MG per tablet Take 1 tablet by mouth as needed.  . linaclotide (LINZESS) 145 MCG CAPS capsule Take 145 mcg by mouth daily as needed.  . Multiple Vitamin (MULTIVITAMIN) tablet Take 1 tablet by mouth daily.  Marland Kitchen omeprazole (PRILOSEC) 20 MG capsule TAKE 1 CAPSULE (20 MG TOTAL) BY MOUTH DAILY.  . rizatriptan (MAXALT) 10 MG tablet Take 10 mg by mouth as needed. May repeat in 2 hours if needed  . [DISCONTINUED] LINZESS 145 MCG CAPS capsule TAKE 1 CAPSULE (145 MCG TOTAL) BY MOUTH DAILY.  Marland Kitchen dicyclomine (BENTYL) 10 MG capsule Take 1 capsule (10 mg total) by mouth  2 (two) times daily.  Marland Kitchen linaclotide (LINZESS) 72 MCG capsule Take 1 capsule (72 mcg total) by mouth daily before breakfast.  . [DISCONTINUED] bisacodyl (DULCOLAX) 5 MG EC tablet Take 5 mg by mouth. Dulcolax 5 mg bowel prep #4-Take as directed  . [DISCONTINUED] mometasone (NASONEX) 50 MCG/ACT nasal spray Place 2 sprays into the nose as needed.   . [DISCONTINUED] Vitamin D, Ergocalciferol, (DRISDOL) 50000 units CAPS capsule Take 50,000 Units by mouth every 7 (seven) days.   No facility-administered encounter medications on file as of 07/07/2016.      REVIEW OF SYSTEMS  : All other systems reviewed and negative except where noted in the History  of Present Illness.   PHYSICAL EXAM: BP 110/70   Pulse 72   Ht 5\' 2"  (1.575 m)   Wt 126 lb (57.2 kg)   BMI 23.05 kg/m  General: Well developed white female in no acute distress Head: Normocephalic and atraumatic Eyes:  Sclerae anicteric, conjunctiva pink. Ears: Normal auditory acuity Lungs: Clear throughout to auscultation Heart: Regular rate and rhythm Abdomen: Soft, non-distended. Normal bowel sounds.  Mild diffuse TTP. Musculoskeletal: Symmetrical with no gross deformities  Skin: No lesions on visible extremities Extremities: No edema  Neurological: Alert oriented x 4, grossly non-focal Psychological:  Alert and cooperative. Normal mood and affect  ASSESSMENT AND PLAN: -51 year old female with chronic complaints of abdominal pain, diffuse but sometimes localized to the left side, constipation, abdominal bloating. By her exam today do not think she has diverticulitis. She has never had documented radiographic imaging of diverticulitis. She had similar complaints at her last visit and CT scan at that time was negative for acute diverticulitis. I think that she may have multifactorial causes of her symptoms including IBS-C, possibly symptomatic diverticulosis, lactose intolerance. Some of her abdominal pain may be related to adhesions/scar tissue from previous surgery. I am going to place her on Linzess 72 g daily and I want her to take it on a regular basis. I am also going to place her on Bentyl 10 mg twice a day for intestinal spasms/cramping. She may try daily probiotic such as Florastor or VSL #3. Can try IBgard for bloating symptoms as well. I'm also going to have her go on a lactose-free diet. She will follow up here in 4-6 weeks. We will re-assess her symptoms at that time. Could consider treatment with Xifaxan versus Fodmap diet at that time if still having symptoms.   CC:  Monico Blitz, MD

## 2016-07-08 LAB — TISSUE TRANSGLUTAMINASE, IGA: TISSUE TRANSGLUTAMINASE AB, IGA: 1 U/mL (ref <4)

## 2016-08-27 ENCOUNTER — Ambulatory Visit: Payer: BC Managed Care – PPO | Admitting: Gastroenterology

## 2016-09-08 ENCOUNTER — Telehealth: Payer: Self-pay | Admitting: Gastroenterology

## 2016-09-08 ENCOUNTER — Ambulatory Visit (INDEPENDENT_AMBULATORY_CARE_PROVIDER_SITE_OTHER): Payer: BC Managed Care – PPO | Admitting: Gastroenterology

## 2016-09-08 ENCOUNTER — Encounter: Payer: Self-pay | Admitting: Gastroenterology

## 2016-09-08 VITALS — BP 98/62 | HR 56 | Ht 63.0 in | Wt 126.6 lb

## 2016-09-08 DIAGNOSIS — K581 Irritable bowel syndrome with constipation: Secondary | ICD-10-CM | POA: Diagnosis not present

## 2016-09-08 DIAGNOSIS — R141 Gas pain: Secondary | ICD-10-CM | POA: Diagnosis not present

## 2016-09-08 DIAGNOSIS — R140 Abdominal distension (gaseous): Secondary | ICD-10-CM

## 2016-09-08 MED ORDER — RIFAXIMIN 550 MG PO TABS: 550.0000 mg | ORAL_TABLET | Freq: Three times a day (TID) | ORAL | 0 refills | Status: DC

## 2016-09-08 MED ORDER — LINACLOTIDE 72 MCG PO CAPS: 72.0000 ug | ORAL_CAPSULE | Freq: Every day | ORAL | 3 refills | Status: DC

## 2016-09-08 NOTE — Patient Instructions (Signed)
We have sent your demographic information and a prescription for Xifaxan to [facility]. This pharmacy is able to get medication approved through insurance and get you the lowest copay possible. If you have not heard from them within 1 week, please call our office at [phone] to let us know.  We have sent the following medications to your pharmacy for you to pick up at your convenience: Linzess 72 mcg daily  Call back in 4 weeks with an update on how Xifaxan worked ask for [employer], Therapist, sports.

## 2016-09-08 NOTE — Progress Notes (Signed)
09/08/2016 [name] [id number] [birth year]   HISTORY OF PRESENT ILLNESS:  This is a 51 year old female who is known to Dr. Ardis Hughs. She has recurrent/chronic complaints of abdominal pain, bloating, constipation.  She was just seen by me in April for the same complaints.  I placed her on Linzess 72 mcg daily, Florastor BID, and IBgard BID prn.  She is doing somewhat better with those.  I also gave her Bentyl, but she says that after trying that for several days she noticed that it was causing swelling in her legs so she has discontinued it.  She says that she still gets a lot of bloating in her abdomen especially when eating foods like salads, etc.  CT scan abdomen and pelvis with contrast on 03/2015 did not show any evidence of diverticulitis. Last colonoscopy was February 2017 at which time she was found have moderate diverticulosis in the left colon. EGD at that time as well was normal.    Past Medical History:  Diagnosis Date  . Abdominal pain   . Chest pain 10.7.2010   stress echo...abnormal  . Colon polyp   . Constipation   . Depression   . GERD (gastroesophageal reflux disease)   . Hemorrhoid   . History of panic attacks   . HOH (hard of hearing)    left ear  . Hyperventilation   . IBS (irritable bowel syndrome)   . Interstitial cystitis   . Maxillary sinusitis   . Migraines   . Seasonal allergies   . Urinary tract bacterial infections    Past Surgical History:  Procedure Laterality Date  . LAPAROSCOPY     abd  . SALPINGECTOMY    . SPINAL FUSION    . TUBAL LIGATION    . VAGINAL HYSTERECTOMY      reports that she has never smoked. She has never used smokeless tobacco. She reports that she does not drink alcohol or use drugs. family history includes COPD in her father; Colon cancer in her cousin and paternal aunt; Diabetes in her maternal grandfather; Irritable bowel syndrome in her son; Leukemia in her cousin; Ovarian cancer in her cousin; Prostate cancer in  her paternal uncle. Allergies  Allergen Reactions  . Morphine And Related     Rash,SOB,whelts  . Codeine     rash  . Depo-Medrol [Methylprednisolone Acetate]     Rash, anxiety      Outpatient Encounter Prescriptions as of 09/08/2016  Medication Sig  . AMBULATORY NON FORMULARY MEDICATION Take 1 capsule by mouth every morning. Medication Name: IBGard  . aspirin-acetaminophen-caffeine (EXCEDRIN MIGRAINE) 250-250-65 MG per tablet Take 1 tablet by mouth as needed.  . dicyclomine (BENTYL) 10 MG capsule Take 1 capsule (10 mg total) by mouth 2 (two) times daily.  Marland Kitchen linaclotide (LINZESS) 72 MCG capsule Take 1 capsule (72 mcg total) by mouth daily before breakfast.  . Multiple Vitamin (MULTIVITAMIN) tablet Take 1 tablet by mouth daily.  Marland Kitchen omeprazole (PRILOSEC) 20 MG capsule TAKE 1 CAPSULE (20 MG TOTAL) BY MOUTH DAILY.  . rizatriptan (MAXALT) 10 MG tablet Take 10 mg by mouth as needed. May repeat in 2 hours if needed  . saccharomyces boulardii (FLORASTOR) 250 MG capsule Take 250 mg by mouth 2 (two) times daily.  Marland Kitchen linaclotide (LINZESS) 145 MCG CAPS capsule Take 145 mcg by mouth daily as needed.   No facility-administered encounter medications on file as of 09/08/2016.      REVIEW OF SYSTEMS  : All other systems reviewed  and negative except where noted in the History of Present Illness.   PHYSICAL EXAM: BP 98/62   Pulse (!) 56   Ht 5\' 3"  (1.6 m)   Wt 126 lb 9.6 oz (57.4 kg)   BMI 22.43 kg/m  General: Well developed white female in no acute distress Head: Normocephalic and atraumatic Eyes:  Sclerae anicteric, conjunctiva pink. Ears: Normal auditory acuity Lungs: Clear throughout to auscultation; no increased WOB. Heart: Regular rate and rhythm Abdomen: Soft, non-distended.  BS present.  Non-tender. Musculoskeletal: Symmetrical with no gross deformities  Skin: No lesions on visible extremities Extremities: No edema  Neurological: Alert oriented x 4, grossly non-focal Psychological:   Alert and cooperative. Normal mood and affect  ASSESSMENT AND PLAN: -51 year old female with chronic complaints of abdominal pain, diffuse but sometimes localized to the left side, constipation, abdominal bloating.  Here for follow-up.  I think that she may have multifactorial causes of her symptoms including IBS-C, possibly symptomatic diverticulosis, lactose intolerance. Some of her abdominal pain may be related to adhesions/scar tissue from previous surgery. Doing somewhat better on Linzess 72 mcg daily, IBgard daily, and Florastor BID.  Bentyl caused swelling in her legs.  Will try a course of Xifaxan 550 mg TID for two weeks and see if that helps any further.  Otherwise will continue the medications listed above.  *Will call to give an update on her symptoms in about 4 weeks.  CC:  Monico Blitz, MD

## 2016-09-09 NOTE — Progress Notes (Signed)
I agree with the above note, plan 

## 2016-11-22 ENCOUNTER — Other Ambulatory Visit: Payer: Self-pay | Admitting: Gastroenterology

## 2017-07-19 ENCOUNTER — Telehealth: Payer: Self-pay | Admitting: Gastroenterology

## 2017-07-19 NOTE — Telephone Encounter (Signed)
Dr Jacobs is this ok? 

## 2017-07-19 NOTE — Telephone Encounter (Signed)
That is ok with me if it is ok with Dr. Silverio Decamp

## 2017-07-20 NOTE — Telephone Encounter (Signed)
Dr Nandigam is this ok? 

## 2017-07-21 NOTE — Telephone Encounter (Signed)
Ok

## 2017-07-21 NOTE — Telephone Encounter (Signed)
Left a message for the patient with that information. Requested she call to schedule an appointment if she was having any GI issues presently.

## 2017-07-21 NOTE — Telephone Encounter (Signed)
[name] she is transferring to Dr Silverio Decamp.

## 2017-08-10 ENCOUNTER — Telehealth: Payer: Self-pay | Admitting: Gastroenterology

## 2017-08-10 NOTE — Telephone Encounter (Signed)
Patient states she is having stabbing pain in her left side and thinks she is having a a diverticulosis flare up. Pt wanting to know what to do since Dr.Nandigam has not appointments open.

## 2017-08-10 NOTE — Telephone Encounter (Signed)
Patient reports the LLQ has been painful and it is not getting worse. Her bowels do not "move right." She is now a patient of Dr Woodward Ku. She would like to be seen. 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 Appointment scheduled.

## 2017-08-11 ENCOUNTER — Ambulatory Visit: Payer: BC Managed Care – PPO | Admitting: Physician Assistant

## 2017-08-11 ENCOUNTER — Encounter: Payer: Self-pay | Admitting: Physician Assistant

## 2017-08-11 ENCOUNTER — Other Ambulatory Visit (INDEPENDENT_AMBULATORY_CARE_PROVIDER_SITE_OTHER): Payer: BC Managed Care – PPO

## 2017-08-11 ENCOUNTER — Encounter (INDEPENDENT_AMBULATORY_CARE_PROVIDER_SITE_OTHER): Payer: Self-pay

## 2017-08-11 ENCOUNTER — Telehealth: Payer: Self-pay

## 2017-08-11 VITALS — BP 110/64 | HR 70 | Ht 63.0 in | Wt 136.6 lb

## 2017-08-11 DIAGNOSIS — R195 Other fecal abnormalities: Secondary | ICD-10-CM

## 2017-08-11 DIAGNOSIS — Z1211 Encounter for screening for malignant neoplasm of colon: Secondary | ICD-10-CM

## 2017-08-11 DIAGNOSIS — R1032 Left lower quadrant pain: Secondary | ICD-10-CM

## 2017-08-11 DIAGNOSIS — Z86010 Personal history of colonic polyps: Secondary | ICD-10-CM

## 2017-08-11 DIAGNOSIS — R194 Change in bowel habit: Secondary | ICD-10-CM | POA: Diagnosis not present

## 2017-08-11 DIAGNOSIS — K5732 Diverticulitis of large intestine without perforation or abscess without bleeding: Secondary | ICD-10-CM

## 2017-08-11 DIAGNOSIS — Z8719 Personal history of other diseases of the digestive system: Secondary | ICD-10-CM

## 2017-08-11 DIAGNOSIS — Z860101 Personal history of adenomatous and serrated colon polyps: Secondary | ICD-10-CM

## 2017-08-11 LAB — CBC WITH DIFFERENTIAL/PLATELET
BASOS PCT: 0.5 % (ref 0.0–3.0)
Basophils Absolute: 0 10*3/uL (ref 0.0–0.1)
EOS ABS: 0.2 10*3/uL (ref 0.0–0.7)
Eosinophils Relative: 3.1 % (ref 0.0–5.0)
HEMATOCRIT: 39.3 % (ref 36.0–46.0)
Hemoglobin: 13.6 g/dL (ref 12.0–15.0)
LYMPHS ABS: 2 10*3/uL (ref 0.7–4.0)
LYMPHS PCT: 28.3 % (ref 12.0–46.0)
MCHC: 34.6 g/dL (ref 30.0–36.0)
MCV: 95.3 fl (ref 78.0–100.0)
Monocytes Absolute: 0.5 10*3/uL (ref 0.1–1.0)
Monocytes Relative: 7.2 % (ref 3.0–12.0)
NEUTROS ABS: 4.3 10*3/uL (ref 1.4–7.7)
NEUTROS PCT: 60.9 % (ref 43.0–77.0)
PLATELETS: 178 10*3/uL (ref 150.0–400.0)
RBC: 4.13 Mil/uL (ref 3.87–5.11)
RDW: 13.4 % (ref 11.5–15.5)
WBC: 7 10*3/uL (ref 4.0–10.5)

## 2017-08-11 LAB — COMPREHENSIVE METABOLIC PANEL
ALT: 12 U/L (ref 0–35)
AST: 15 U/L (ref 0–37)
Albumin: 4.2 g/dL (ref 3.5–5.2)
Alkaline Phosphatase: 62 U/L (ref 39–117)
BUN: 15 mg/dL (ref 6–23)
CHLORIDE: 104 meq/L (ref 96–112)
CO2: 29 meq/L (ref 19–32)
CREATININE: 0.87 mg/dL (ref 0.40–1.20)
Calcium: 9.5 mg/dL (ref 8.4–10.5)
GFR: 72.33 mL/min (ref >60.00)
GLUCOSE: 90 mg/dL (ref 70–99)
Potassium: 3.8 mEq/L (ref 3.5–5.1)
SODIUM: 140 meq/L (ref 135–145)
Total Bilirubin: 0.4 mg/dL (ref 0.2–1.2)
Total Protein: 6.9 g/dL (ref 6.0–8.3)

## 2017-08-11 MED ORDER — DICYCLOMINE HCL 10 MG PO CAPS: ORAL_CAPSULE | ORAL | 3 refills | Status: DC

## 2017-08-11 MED ORDER — AMOXICILLIN-POT CLAVULANATE 875-125 MG PO TABS
ORAL_TABLET | ORAL | 0 refills | Status: DC
Start: 2017-08-11 — End: 2017-09-07

## 2017-08-11 NOTE — Telephone Encounter (Signed)
I left a message on her voicemail that labs were normal.

## 2017-08-11 NOTE — Progress Notes (Signed)
Subjective:    Patient ID: [name], female    DOB: 11-Feb-1966, 52 y.o.   MRN: [id number]  HPI [name] is a pleasant 52 year old white female, known previously to Dr. Ardis Hughs who had requested and was approved to transfer to Dr. Silverio Decamp. She has history of GERD, IBS, chronic constipation, anxiety.  She has had prior pelvic surgeries initially with partial hysterectomy and then full hysterectomy, and left oophorectomy.  She believes the right ovary is in situ. Last EGD February 2017 was normal, colonoscopy February 2017 with moderate diverticulosis otherwise negative though she does have history of a sessile serrated polyp on colonoscopy 2013. She has family history of colon cancer in a paternal aunt diagnosed in her early 20s and a cousin diagnosed in her early 62s. Patient says she had onset of acute left lower quadrant pain yesterday which was sharp and intense.  Said this lasted all day and was stabbing in nature and would intermittently make her want to double over.  She was uncomfortable walking.  Yesterday evening she had chills and generalized aching but no documented fever.  No nausea or vomiting.  She did have a small bowel movement yesterday and she is concerned beasts because her stools have been more narrow over the past couple of months she has has seen a reddish tent at times. She also mentions that she is been having pain with intercourse on the left side over the past month. Longer-term she has had ongoing issues with gas and bloating which have become somewhat worse. She had been tried on IBgard and Florastor in the past without any benefit.  Exline She had been given a prescription for Linzess which she says did not work well, she is tried MiraLAX regularly in the past without benefit. She mentions that she is switching providers because she did not feel that she was being listened to by the previous providers here.  She admits to being very worried about cancer.  Review of  Systems Pertinent positive and negative review of systems were noted in the above HPI section.  All other review of systems was otherwise negative.  Outpatient Encounter Medications as of 08/11/2017  Medication Sig  . AMBULATORY NON FORMULARY MEDICATION Take 1 capsule by mouth every morning. Medication Name: IBGard  . aspirin-acetaminophen-caffeine (EXCEDRIN MIGRAINE) 250-250-65 MG per tablet Take 1 tablet by mouth as needed.  . Multiple Vitamin (MULTIVITAMIN) tablet Take 1 tablet by mouth daily.  Marland Kitchen omeprazole (PRILOSEC) 20 MG capsule TAKE 1 CAPSULE (20 MG TOTAL) BY MOUTH DAILY.  . rizatriptan (MAXALT) 10 MG tablet Take 10 mg by mouth as needed. May repeat in 2 hours if needed  . amoxicillin-clavulanate (AUGMENTIN) 875-125 MG tablet Tke 1 tab by mouth twice daily for 10 days.  Marland Kitchen dicyclomine (BENTYL) 10 MG capsule Take 1 tab by mouth twice daily as needed for cramping/spasms.  . [DISCONTINUED] dicyclomine (BENTYL) 10 MG capsule Take 1 capsule (10 mg total) by mouth 2 (two) times daily. (Patient not taking: Reported on 08/11/2017)  . [DISCONTINUED] linaclotide (LINZESS) 72 MCG capsule Take 1 capsule (72 mcg total) by mouth daily before breakfast. (Patient not taking: Reported on 08/11/2017)  . [DISCONTINUED] LINZESS 72 MCG capsule TAKE 1 CAPSULE (72 MCG TOTAL) BY MOUTH DAILY BEFORE BREAKFAST. (Patient not taking: Reported on 08/11/2017)  . [DISCONTINUED] rifaximin (XIFAXAN) 550 MG TABS tablet Take 1 tablet (550 mg total) by mouth 3 (three) times daily. (Patient not taking: Reported on 08/11/2017)  . [DISCONTINUED] saccharomyces boulardii (FLORASTOR) 250  MG capsule Take 250 mg by mouth 2 (two) times daily.   No facility-administered encounter medications on file as of 08/11/2017.    Allergies  Allergen Reactions  . Morphine And Related     Rash,SOB,whelts  . Codeine     rash  . Depo-Medrol [Methylprednisolone Acetate]     Rash, anxiety   Patient Active Problem List   Diagnosis Date Noted  .  Bloating 07/07/2016  . Constipation 07/07/2016  . 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 Lower gastrointestinal bleed 02/22/2012  . Bowel habit changes 02/22/2012  . Gas pain 02/22/2012  . CHEST PAIN 01/14/2009  . ANXIETY STATE, UNSPECIFIED 01/13/2009  . MIGRAINE HEADACHE 01/13/2009  . UNSPECIFIED BACKACHE 01/13/2009  . PROCTALGIA FUGAX 05/15/2008  . GERD 05/14/2008  . IRRITABLE BOWEL SYNDROME 05/14/2008  . FATIGUE 05/14/2008   Social History   Socioeconomic History  . Marital status: Married    Spouse name: Not on file  . Number of children: 3  . Years of education: Not on file  . Highest education level: Not on file  Occupational History    Employer: [employer]  Social Needs  . Financial resource strain: Not on file  . Food insecurity:    Worry: Not on file    Inability: Not on file  . Transportation needs:    Medical: Not on file    Non-medical: Not on file  Tobacco Use  . Smoking status: Never Smoker  . Smokeless tobacco: Never Used  Substance and Sexual Activity  . Alcohol use: No    Alcohol/week: 0.0 oz  . Drug use: No  . Sexual activity: Not on file  Lifestyle  . Physical activity:    Days per week: Not on file    Minutes per session: Not on file  . Stress: Not on file  Relationships  . Social connections:    Talks on phone: Not on file    Gets together: Not on file    Attends religious service: Not on file    Active member of club or organization: Not on file    Attends meetings of clubs or organizations: Not on file    Relationship status: Not on file  . Intimate partner violence:    Fear of current or ex partner: Not on file    Emotionally abused: Not on file    Physically abused: Not on file    Forced sexual activity: Not on file  Other Topics Concern  . Not on file  Social History Narrative   About 2 caffeinated beverages daily    Ms. Estock's family history includes COPD in her father; Colon cancer in her cousin and  paternal aunt; Diabetes in her maternal grandfather; Irritable bowel syndrome in her son; Leukemia in her cousin; Ovarian cancer in her cousin; Prostate cancer in her paternal uncle.      Objective:    Vitals:   08/11/17 0916  BP: 110/64  Pulse: 70    Physical Exam; well-developed white female in no acute distress, pleasant, anxious, blood pressure 110/64, pulse 70, height 5 foot 3, weight 136, BMI 24.2.  HEENT; nontraumatic normocephalic EOMI PERRLA sclera anicteric, Oropharynx clear, Cardiovascular ;regular rate and rhythm with S1-S2, pulmonary clear bilaterally, Abdomen; soft, nondistended bowel sounds are active there is no palpable mass or hepatosplenomegaly, she is tender in the left lower quadrant no palpable fullness, no rebound.  Rectal; exam not done, Extremities; no clubbing cyanosis or edema skin warm and dry, Neuro psych ;alert and oriented,  grossly nonfocal mood and affect appropriate       Assessment & Plan:   #82 52 year old white female with acute left lower quadrant pain onset yesterday, aching and stabbing in nature and fairly constant.  Over the past month she has been noticing discomfort with intercourse on the left, and worsening of gas and bloating as well as change in bowel habits with narrow caliber stools.  Patient also questioning whether she may have seen some blood on occasion. She does have history of diverticulosis.  She has not been treated for diverticulitis in the past. Rule out acute diverticulitis, rule out other acute pelvic inflammatory process.  Patient has had 2 previous pelvic surgeries also question role of adhesions.  #2 GERD #3.  IBS #4.  Constipation #5.  Anxiety #6.  Status post hysterectomy and left oophorectomy   #7 family history of colon cancer paternal aunt and a cousin  Plan; start Augmentin 875 mg p.o. x10 days Start Bentyl 10 mg p.o. twice daily CBC with differential, C met, sed rate We will schedule for CT of the abdomen and  pelvis with contrast Add Benefiber 1 scoop daily in a glass of water or juice. Patient will review of low gas diet and try to eliminate any high gas producing foods. Will plan office follow-up in a few weeks.  Patient will be established with Dr. Silverio Decamp Patient had been told after last colonoscopy to have follow-up in 10 years, with personal history of sessile serrated polyp and family history would consider 5-year interval follow-up  Amy Genia Harold PA-C 08/11/2017   Cc: Monico Blitz, MD

## 2017-08-11 NOTE — Patient Instructions (Addendum)
Please go to the basement level to have your labs drawn.   We sent prescriptions to [place].  1. Bentyl 10 mg. 2. Augmentin 875 mg   You have been scheduled for a CT scan of the abdomen and pelvis at [facility] Radiology 1st floor.  You are scheduled on Friday 5-17 at 7:30 am. You should arrive 15 minutes prior to your appointment time for registration. Please follow the written instructions below on the day of your exam:  WARNING: IF YOU ARE ALLERGIC TO IODINE/X-RAY DYE, PLEASE NOTIFY RADIOLOGY IMMEDIATELY AT [phone]! YOU WILL BE GIVEN A 13 HOUR PREMEDICATION PREP.  1) Do not eat or drink anything after midnight. 2) You have been given 2 bottles of oral contrast to drink. The solution may taste better if refrigerated, but do NOT add ice or any other liquid to this solution. Shake well before drinking.    Drink 1 bottle of contrast @  5:30 am(2 hours prior to your exam)  Drink 1 bottle of contrast @ 6:30 am (1 hour prior to your exam)  You may take any medications as prescribed with a small amount of water except for the following: Metformin, Glucophage, Glucovance, Avandamet, Riomet, Fortamet, Actoplus Met, Janumet, Glumetza or Metaglip. The above medications must be held the day of the exam AND 48 hours after the exam.  The purpose of you drinking the oral contrast is to aid in the visualization of your intestinal tract. The contrast solution may cause some diarrhea. Before your exam is started, you will be given a small amount of fluid to drink. Depending on your individual set of symptoms, you may also receive an intravenous injection of x-ray contrast/dye. Plan on being at [facility] for 30 minutes or long, depending on the type of exam you are having performed.  If you have any questions regarding your exam or if you need to reschedule, you may call the CT department at [phone] between the hours of 8:00 am and 5:00 pm, Monday-Friday.  If you are age 53 or younger,  your body mass index should be between 19-25. Your Body mass index is 24.2 kg/m. If this is out of the aformentioned range listed, please consider follow up with your Primary Care Provider.    ________________________________________________________________________

## 2017-08-12 ENCOUNTER — Telehealth: Payer: Self-pay | Admitting: Physician Assistant

## 2017-08-12 ENCOUNTER — Ambulatory Visit (HOSPITAL_COMMUNITY)
Admission: RE | Admit: 2017-08-12 | Discharge: 2017-08-12 | Disposition: A | Discharge status: Home / Self Care | Payer: BC Managed Care – PPO | Source: Ambulatory Visit | Attending: Physician Assistant | Admitting: Physician Assistant

## 2017-08-12 DIAGNOSIS — R194 Change in bowel habit: Secondary | ICD-10-CM | POA: Diagnosis not present

## 2017-08-12 DIAGNOSIS — Z86010 Personal history of colonic polyps: Secondary | ICD-10-CM | POA: Insufficient documentation

## 2017-08-12 DIAGNOSIS — K5732 Diverticulitis of large intestine without perforation or abscess without bleeding: Secondary | ICD-10-CM | POA: Diagnosis not present

## 2017-08-12 DIAGNOSIS — Z1211 Encounter for screening for malignant neoplasm of colon: Secondary | ICD-10-CM | POA: Diagnosis not present

## 2017-08-12 DIAGNOSIS — R195 Other fecal abnormalities: Secondary | ICD-10-CM

## 2017-08-12 DIAGNOSIS — Z8719 Personal history of other diseases of the digestive system: Secondary | ICD-10-CM | POA: Insufficient documentation

## 2017-08-12 DIAGNOSIS — R1032 Left lower quadrant pain: Secondary | ICD-10-CM | POA: Insufficient documentation

## 2017-08-12 MED ORDER — IOPAMIDOL (ISOVUE-300) INJECTION 61%
100.0000 mL | Freq: Once | INTRAVENOUS | Status: AC | PRN
  Administered 2017-08-12: 100 mL via INTRAVENOUS

## 2017-08-12 MED ORDER — IOPAMIDOL (ISOVUE-300) INJECTION 61%
INTRAVENOUS | Status: AC
  Filled 2017-08-12: qty 100

## 2017-08-12 NOTE — Progress Notes (Signed)
Reviewed and agree with documentation and assessment and plan. K. Veena Nandigam , MD   

## 2017-08-12 NOTE — Telephone Encounter (Signed)
agree

## 2017-08-12 NOTE — Telephone Encounter (Signed)
I discussed with the patient that the CT did show acute diverticulitis of the sigmoid colon.  She is advised to complete the antibiotics prescribed yesterday by Nicoletta Ba PA.  She reports that it now hurts to ambulate.  She is advised to rest and avoid any strenuous activity or lifting for the next 48 hours.  She will call on Monday if her symptoms are not significantly improved.   Dr. Carlean Purl you are MD of the day.

## 2017-08-15 ENCOUNTER — Telehealth: Payer: Self-pay | Admitting: Physician Assistant

## 2017-08-15 NOTE — Telephone Encounter (Signed)
I spoke with her and she is feeling better, still on antibiotic and will call if symptoms do not continue to improve.  Appointment scheduled for 09/07/17 at 8:30 am.

## 2017-08-15 NOTE — Telephone Encounter (Signed)
Called the patient. She is taking and tolerating the antibiotics. Takes the "blue pill" for spasms (Bentyl). It helps for a little while but she is not sure for how long. Patient then states she would like to discuss the rectal pain she has been having "for at least a year". She states her rectum will start hurting with "sharp pain" at various times, lasting 30 minutes. She cannot recreate the pain, "it is random" and she has not found a way to relieve it.  She also states not matter what she eats, her abdomen swells. She "is tired of it" and "no one seems to be able to figure it out."

## 2017-08-31 ENCOUNTER — Ambulatory Visit: Payer: BC Managed Care – PPO | Admitting: Physician Assistant

## 2017-09-07 ENCOUNTER — Ambulatory Visit: Payer: BC Managed Care – PPO | Admitting: Physician Assistant

## 2017-09-07 ENCOUNTER — Encounter: Payer: Self-pay | Admitting: Physician Assistant

## 2017-09-07 VITALS — BP 118/70 | HR 68 | Ht 63.0 in | Wt 136.8 lb

## 2017-09-07 DIAGNOSIS — K581 Irritable bowel syndrome with constipation: Secondary | ICD-10-CM

## 2017-09-07 DIAGNOSIS — K5732 Diverticulitis of large intestine without perforation or abscess without bleeding: Secondary | ICD-10-CM

## 2017-09-07 MED ORDER — DICYCLOMINE HCL 10 MG PO CAPS: ORAL_CAPSULE | ORAL | 11 refills | Status: DC

## 2017-09-07 MED ORDER — LINACLOTIDE 72 MCG PO CAPS: ORAL_CAPSULE | ORAL | 11 refills | Status: DC

## 2017-09-07 NOTE — Progress Notes (Signed)
Subjective:    Patient ID: [name], female    DOB: 04-04-1965, 52 y.o.   MRN: [id number]  HPI [name] is a pleasant 52 year old white female, known to Dr. Ardis Hughs and myself who comes in today for follow-up.  She was last seen on 08/11/2017 and diagnosed with diverticulitis.  She had CT of the abdomen and pelvis done on 08/12/2017 which did show uncomplicated acute sigmoid diverticulitis.  She was treated with a 10-day course of Augmentin and Bentyl twice daily as needed. Last colonoscopy was done in February 2017 showing moderate diverticulosis.  She has history of a sessile serrated polyp on colonoscopy 2013. She says that her ongoing pain is gone but she continues to have some intermittent bloating occasional rectal spasm type feeling and constipation.  He is usually having a bowel movement 3 times a week.  She says sometimes her stools are small and seem to be somewhat narrow in caliber.  He has not noticed any bleeding.  She does have history of IBS, and chronic anxiety.  She follows a lactose-free diet, avoids carbonated beverages and she says she has not been using the Bentyl on a regular basis. She also asks if she could be given a trial of Linzess again.  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 Is also asking about timing of follow-up colonoscopy.  She does have a first cousin with colon cancer, and a paternal aunt who was diagnosed with colon cancer in her early 70s.     Review of Systems Pertinent positive and negative review of systems were noted in the above HPI section.  All other review of systems was otherwise negative.  Outpatient Encounter Medications as of 09/07/2017  Medication Sig  . AMBULATORY NON FORMULARY MEDICATION Take 1 capsule by mouth every morning. Medication Name:  IBGard  . aspirin-acetaminophen-caffeine (EXCEDRIN MIGRAINE) 250-250-65 MG per tablet Take 1 tablet by mouth as needed.  . dicyclomine (BENTYL) 10 MG capsule Take 1 tab by mouth twice daily as needed for cramping/spasms.  . Multiple Vitamin (MULTIVITAMIN) tablet Take 1 tablet by mouth daily.  Marland Kitchen omeprazole (PRILOSEC) 20 MG capsule TAKE 1 CAPSULE (20 MG TOTAL) BY MOUTH DAILY.  . rizatriptan (MAXALT) 10 MG tablet Take 10 mg by mouth as needed. May repeat in 2 hours if needed  . [DISCONTINUED] dicyclomine (BENTYL) 10 MG capsule Take 1 tab by mouth twice daily as needed for cramping/spasms.  Marland Kitchen linaclotide (LINZESS) 72 MCG capsule Take 1 capsule daily.  . [DISCONTINUED] amoxicillin-clavulanate (AUGMENTIN) 875-125 MG tablet Tke 1 tab by mouth twice daily for 10 days. (Patient not taking: Reported on 09/07/2017)   No facility-administered encounter medications on file as of 09/07/2017.    Allergies  Allergen Reactions  . Morphine And Related     Rash,SOB,whelts  . Codeine     rash  . Depo-Medrol [Methylprednisolone Acetate]     Rash, anxiety   Patient Active Problem List   Diagnosis Date Noted  . Bloating 07/07/2016  . Constipation 07/07/2016  . 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 Lower gastrointestinal bleed 02/22/2012  . Bowel habit changes 02/22/2012  . Gas pain 02/22/2012  . CHEST PAIN 01/14/2009  . ANXIETY STATE, UNSPECIFIED 01/13/2009  . MIGRAINE HEADACHE 01/13/2009  . UNSPECIFIED BACKACHE 01/13/2009  .  PROCTALGIA FUGAX 05/15/2008  . GERD 05/14/2008  . IRRITABLE BOWEL SYNDROME 05/14/2008  . FATIGUE 05/14/2008   Social History   Socioeconomic History  . Marital status: Married    Spouse name: Not on file  . Number of children: 3  . Years of education: Not on file  . Highest education level: Not on file  Occupational History    Employer: [employer]  Social Needs  . Financial resource strain: Not on file  . Food insecurity:    Worry: Not on file     Inability: Not on file  . Transportation needs:    Medical: Not on file    Non-medical: Not on file  Tobacco Use  . Smoking status: Never Smoker  . Smokeless tobacco: Never Used  Substance and Sexual Activity  . Alcohol use: No    Alcohol/week: 0.0 oz  . Drug use: No  . Sexual activity: Not on file  Lifestyle  . Physical activity:    Days per week: Not on file    Minutes per session: Not on file  . Stress: Not on file  Relationships  . Social connections:    Talks on phone: Not on file    Gets together: Not on file    Attends religious service: Not on file    Active member of club or organization: Not on file    Attends meetings of clubs or organizations: Not on file    Relationship status: Not on file  . Intimate partner violence:    Fear of current or ex partner: Not on file    Emotionally abused: Not on file    Physically abused: Not on file    Forced sexual activity: Not on file  Other Topics Concern  . Not on file  Social History Narrative   About 2 caffeinated beverages daily    [name]'s family history includes COPD in her father; Colon cancer in her cousin and paternal aunt; Diabetes in her maternal grandfather; Irritable bowel syndrome in her son; Leukemia in her cousin; Ovarian cancer in her cousin; Prostate cancer in her paternal uncle.      Objective:    Vitals:   09/07/17 0841  BP: 118/70  Pulse: 68    Physical Exam; well-developed white female in no acute distress, pleasant blood pressure 118/70 pulse 68, height 5 foot 3, weight 136, BMI 24.2.  HEENT ;nontraumatic normocephalic EOMI PERRLA sclera anicteric, Cardiovascular ;regular rate and rhythm with S1-S2 no murmur rub or gallop, Pulmonary ;clear bilaterally, Abdomen; soft, she has some mild generalized mid and lower abdominal tenderness no focal tenderness, no guarding or rebound bowel sounds are present, Rectal; exam not done, Extremities ;no clubbing cyanosis or edema skin warm dry, Neuro psych;  alert and oriented, grossly nonfocal, somewhat anxious, mood and affect appropriate       Assessment & Plan:   #2  52 year old white female with recent acute sigmoid diverticulitis-resolved #2 IBS, with persistent intermittent abdominal bloating, constipation, intermittent narrower caliber stools, discomfort and occasional rectal spasms. #3 Family history of colon cancer in a first cousin, and paternal aunt in her early 20s #4 personal history of sessile serrated polyp 2013, negative colonoscopy 2017 #5 anxiety  Plan; Patient will continue lactose-free diet, and avoidance of artificial sweeteners.  I also gave her a copy of the low gas diet Start Bentyl 10 mg p.o. every morning on a regular basis, she can try a second dose mid afternoon as needed Start Linzess 72 mcg daily  Continue Benefiber 2 teaspoons daily She has found IBgard helpful and will continue to twice daily With family history and personal history of sessile serrated polyp follow-up colonoscopy in 2022 seems reasonable. Patient will follow-up with Dr. Ardis Hughs or myself on an as-needed basis.  [name] Genia Harold PA-C 09/07/2017   Cc: Monico Blitz, MD

## 2017-09-07 NOTE — Patient Instructions (Addendum)
We sent prescriptions to [place]. 1. Bentyl ( Dicyclomine) 10 mg 2. Linzess 72 mcg  Take Benefiber 2 tsp in water or juice daily. IBgard- take 2 capules twice daily. We have provided you with a Low Gas Diet.   Follow up   If you are age 53 or younger, your body mass index should be between 19-25. Your Body mass index is 24.23 kg/m. If this is out of the aformentioned range listed, please consider follow up with your Primary Care Provider.

## 2017-09-23 ENCOUNTER — Other Ambulatory Visit: Payer: Self-pay | Admitting: Gastroenterology

## 2018-04-05 ENCOUNTER — Telehealth: Payer: Self-pay | Admitting: Physician Assistant

## 2018-04-05 NOTE — Telephone Encounter (Signed)
Pt called to report that she has severe abdominal pain.  Pt says that Linzess is not working for her.  Pt wants to know if it's OK for her to take milk of magnesia.

## 2018-04-06 NOTE — Telephone Encounter (Signed)
Per Nicoletta Ba PA, it is okay to take the [place] in the morning before she goes to work. Then in the early evening she can take 2 of the 72 mcg tablets which would total 145 mcg. She should take that dose every day. The patient said that just taking 72 mcg that is not helping her to go. The stools are very small and hard.  I advised her to keep her appointment on 04-14-2018 with [facility] PA.

## 2018-04-14 ENCOUNTER — Ambulatory Visit: Payer: BC Managed Care – PPO | Admitting: Physician Assistant

## 2018-04-14 ENCOUNTER — Encounter: Payer: Self-pay | Admitting: *Deleted

## 2018-04-14 VITALS — BP 98/62 | HR 50 | Ht 63.0 in | Wt 149.0 lb

## 2018-04-14 DIAGNOSIS — K581 Irritable bowel syndrome with constipation: Secondary | ICD-10-CM | POA: Diagnosis not present

## 2018-04-14 DIAGNOSIS — R1084 Generalized abdominal pain: Secondary | ICD-10-CM

## 2018-04-14 MED ORDER — DICYCLOMINE HCL 10 MG PO CAPS
ORAL_CAPSULE | ORAL | 2 refills | Status: DC
Start: 2018-04-14 — End: 2019-03-28

## 2018-04-14 MED ORDER — LUBIPROSTONE 8 MCG PO CAPS: 8.0000 ug | ORAL_CAPSULE | Freq: Two times a day (BID) | ORAL | 1 refills | Status: DC

## 2018-04-14 NOTE — Progress Notes (Signed)
I agree with the above note, plan 

## 2018-04-14 NOTE — Patient Instructions (Signed)
We have sent the following medications to your pharmacy for you to pick up at your convenience: Amitiza 8 mcg twice daily (we have given you samples) Dicyclomine 10 mg three times daily, 20-30 minutes before meals  We have given you a low "FODMAP" diet to look over and follow.  Discontinue Linzess.  Follow up with Dr Ardis Hughs as needed.  If you are age 54 or older, your body mass index should be between 23-30. Your Body mass index is 26.39 kg/m. If this is out of the aforementioned range listed, please consider follow up with your Primary Care Provider.  If you are age 86 or younger, your body mass index should be between 19-25. Your Body mass index is 26.39 kg/m. If this is out of the aformentioned range listed, please consider follow up with your Primary Care Provider.

## 2018-04-14 NOTE — Progress Notes (Signed)
Chief Complaint: Abdominal pain  HPI:    [name] is a 53 year old Caucasian female with a past medical history as listed below known to Dr. Ardis Hughs, who presents clinic today for complaint of abdominal pain.     04/2015 colonoscopy with moderate diverticulosis.  History of a sessile serrated polyp and colonoscopy in 2013.    09/07/2017 office visit with Amy.  At that time described some ongoing pain in the left lower quadrant with intermittent bloating and occasional rectal spasm type feeling constipation.  She had a history of IBS and chronic anxiety.  Follow delete lactose-free diet and avoid carbonated beverages and should she not been using her Bentyl on a regular schedule.  She asked for some Linzess.  At that time recommend she continue a lactose-free diet and avoidance of artificial sweeteners.  Given a low gas diet handout and started on Bentyl 10 mg every morning and a second dose in the mid afternoon as needed, also started on Linzess 72 mcg continued on Benefiber 2 teaspoons daily and continued on IBGard 1 tab twice a day.  Repeat colonoscopy recommended 2022.    04/05/2018 phone call with our office.  At that time patient was asking to increase Linzess, okayed to increase to 145 mcg daily.    Today, patient presents clinic and explains that over the past 2 months she has had an increased amount of generalized abdominal pain.  Tells me this is different from her regular pain in the left lower quadrant related to diverticulitis and it is now spreading all over her abdomen.  This is rated as a 5-6/10 during most of the day on most days.  Associated with large amount of abdominal bloating worse after eating.  Describes as "achy/soreness/stabbing".  Not using her Bentyl as she thought this was only for rectal spasms.  Has continued her lactose-free diet and does take IBgard occasionally, but "it is expensive", tells me it does help when she is taking it.  Also currently using Linzess 145 mcg when  she can.  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  Can not have a BM at all without it.    Denies fever, chills, blood in her stool, weight loss, anorexia, nausea, vomiting or symptoms that awaken her from sleep.   Past Medical History:  Diagnosis Date  . Abdominal pain   . Chest pain 10.7.2010   stress echo...abnormal  . Colon polyp   . Constipation   . Depression   . Diverticulitis   . Diverticulosis   . GERD (gastroesophageal reflux disease)   . Hemorrhoid   . History of panic attacks   . HOH (hard of hearing)    left ear  . Hyperventilation   . IBS (irritable bowel syndrome)   . Interstitial cystitis   . Maxillary sinusitis   . Migraines   . Seasonal allergies   . Urinary tract bacterial infections     Past Surgical History:  Procedure Laterality Date  . LAPAROSCOPY     abd  . SALPINGECTOMY    . SPINAL FUSION    . TUBAL LIGATION    . VAGINAL HYSTERECTOMY      Current Outpatient Medications  Medication Sig Dispense Refill  . AMBULATORY NON  FORMULARY MEDICATION Take 1 capsule by mouth every morning. Medication Name: IBGard    . aspirin-acetaminophen-caffeine (EXCEDRIN MIGRAINE) 250-250-65 MG per tablet Take 1 tablet by mouth as needed.    . dicyclomine (BENTYL) 10 MG capsule Take 1 tab by mouth twice daily as needed for cramping/spasms. 60 capsule 11  . linaclotide (LINZESS) 72 MCG capsule Take 1 capsule daily. 30 capsule 11  . Multiple Vitamin (MULTIVITAMIN) tablet Take 1 tablet by mouth daily.    Marland Kitchen omeprazole (PRILOSEC) 20 MG capsule TAKE 1 CAPSULE (20 MG TOTAL) BY MOUTH DAILY. 90 capsule 3  . rizatriptan (MAXALT) 10 MG tablet Take 10 mg by mouth as needed. May repeat in 2 hours if needed     No current facility-administered medications  for this visit.     Allergies as of 04/14/2018 - Review Complete 09/07/2017  Allergen Reaction Noted  . Morphine and related  02/22/2012  . Codeine  02/22/2012  . Depo-medrol [methylprednisolone acetate]  03/01/2012    Family History  Problem Relation Age of Onset  . Colon cancer Paternal Aunt        x2, cousin  . COPD Father   . Ovarian cancer Cousin        mat side  . Colon cancer Cousin   . Leukemia Cousin   . Diabetes Maternal Grandfather        daughter  . Prostate cancer Paternal Uncle   . Irritable bowel syndrome Son     Social History   Socioeconomic History  . Marital status: Married    Spouse name: Not on file  . Number of children: 3  . Years of education: Not on file  . Highest education level: Not on file  Occupational History    Employer: [employer]  Social Needs  . Financial resource strain: Not on file  . Food insecurity:    Worry: Not on file    Inability: Not on file  . Transportation needs:    Medical: Not on file    Non-medical: Not on file  Tobacco Use  . Smoking status: Never Smoker  . Smokeless tobacco: Never Used  Substance and Sexual Activity  . Alcohol use: No    Alcohol/week: 0.0 standard drinks  . Drug use: No  . Sexual activity: Not on file  Lifestyle  . Physical activity:    Days per week: Not on file    Minutes per session: Not on file  . Stress: Not on file  Relationships  . Social connections:    Talks on phone: Not on file    Gets together: Not on file    Attends religious service: Not on file    Active member of club or organization: Not on file    Attends meetings of clubs or organizations: Not on file    Relationship status: Not on file  . Intimate partner violence:    Fear of current or ex partner: Not on file    Emotionally abused: Not on file    Physically abused: Not on file    Forced sexual activity: Not on file  Other Topics Concern  . Not on file  Social History Narrative   About 2  caffeinated beverages daily    Review of Systems:    Constitutional: No weight loss, fever or chills Cardiovascular: No chest pain Respiratory: No SOB  Gastrointestinal: See HPI and otherwise negative   Physical Exam:  Vital signs: BP 98/62   Pulse (!) 50   Ht  5\' 3"  (1.6 m)   Wt 149 lb (67.6 kg)   BMI 26.39 kg/m   Constitutional:   Pleasant Caucasian female appears to be in NAD, Well developed, Well nourished, alert and cooperative Respiratory: Respirations even and unlabored. Lungs clear to auscultation bilaterally.   No wheezes, crackles, or rhonchi.  Cardiovascular: Normal S1, S2. No MRG. Regular rate and rhythm. No peripheral edema, cyanosis or pallor.  Gastrointestinal:  Soft, nondistended, mild generalized ttp, no worse in the LLQ, No rebound or guarding. Normal bowel sounds. No appreciable masses or hepatomegaly. Rectal:  Not performed.  Psychiatric: Demonstrates good judgement and reason without abnormal affect or behaviors.  No recent labs or imaging.  Assessment: 1.  IBS-C: Now with increased generalized abdominal pain and bloating, constipation not very well managed with Linzess 145 mcg because patient cannot take it on a daily basis 2.  Generalized abdominal pain: With above  Plan: 1.  Again reiterated the patient would not be due for colonoscopy uintil 2022, patient is anxious given her family history of colon cancer in her aunts and cousins.  Discussed that this is proper surveillance timing for her. 2.  Encouraged the patient to use her Dicyclomine 10 mg 20 minutes before meals 3 times a day, prescribed #90 with 3 refills. Discussed that this will also help with abdominal cramping/discomfort. 3.  Prescribed Amitiza 8 mcg twice daily, patient should discontinue her Linzess.  Provided her with samples.  Prescribed #60 with 1 refill.  Hopefully this will work better for the patient and she will be able to take it on a more regular basis to avoid constipation. 4.   Reviewed IBS pathophysiology and recommendations. 5.  Patient was given a copy of the low FODMAP diet.  This was discussed in detail. 6.  Encouraged patient to stay lactose-free 7.  Spent a long time discussing the difference between diverticulitis and IBS pain.  I do not believe the patient has diverticulitis today at time of exam, she has been experiencing this pain for 2 months and it is no worse in her left lower quadrant than the rest of her abdomen, no fevers or chills, no change in bowel habits.  Explained that if she does not feel any better after above therapy for a month then she should call our clinic.  At that time, if still worried about diverticulitis could order a CT abdomen/pelvis with contrast for further eval. 8.  Patient to follow in clinic with Dr. Ardis Hughs or Nicoletta Ba, PA-C, who she has followed within the past if she has further complaints.  Ellouise Newer, PA-C [facility] 04/14/2018, 2:00 PM  Cc: Monico Blitz, MD

## 2018-04-23 ENCOUNTER — Other Ambulatory Visit: Payer: Self-pay | Admitting: Physician Assistant

## 2018-04-25 NOTE — Telephone Encounter (Signed)
[place] , [place].  Phoned in prescription per Ellouise Newer PA for the Amitiza 8 mcg take 2 times daily with meals. # 180 with 3 refills.

## 2018-07-24 ENCOUNTER — Telehealth: Payer: Self-pay | Admitting: Physician Assistant

## 2018-07-24 NOTE — Telephone Encounter (Signed)
Pt stated that [name] has prescribed her an antibiotic for diverticulitis; pt requested a refill.

## 2018-07-25 ENCOUNTER — Other Ambulatory Visit: Payer: Self-pay

## 2018-07-25 MED ORDER — METRONIDAZOLE 250 MG PO TABS: 250.0000 mg | ORAL_TABLET | Freq: Three times a day (TID) | ORAL | 0 refills | Status: AC

## 2018-07-25 MED ORDER — CIPROFLOXACIN HCL 500 MG PO TABS: 500.0000 mg | ORAL_TABLET | Freq: Two times a day (BID) | ORAL | 0 refills | Status: AC

## 2018-07-25 NOTE — Telephone Encounter (Signed)
PLease send Rx for Cipro 500mg  BID and flagyl 500mg  TID X 7 days and follow up televisit in 1-2 week. Please advise patient to call if symptoms not better.

## 2018-07-25 NOTE — Telephone Encounter (Signed)
Saw Ellouise Newer last. Dr Ardis Hughs patient. Maybe she should have a televisit?

## 2018-07-25 NOTE — Telephone Encounter (Signed)
Spoke with the patient. Explained plan of care. She states she is unsure what to do about the "knife like pain" but is agreeable to starting antibiotics. Reviewed diet. Will begin with liquid diet next 24 hours. Advanced to soft low residue foods. Maintain soft bowel movements. She is concerned about a televisit stating she does not have Internet services or access to wi-fi. Declines face to face visit also.  Can her follow up be a telephone visit?

## 2018-07-25 NOTE — Telephone Encounter (Signed)
abd pain on the left lower side near groin.  Has hx of diverticulitis.  Has mucous in stool- pain started 2 weeks ago and has gotten worse.  No fever.  No rectal bleeding.  She wants to know if she can have antibiotic sent to [facility].  Please advise

## 2018-07-25 NOTE — Telephone Encounter (Signed)
Pt requested a refill on an antibiotic Amy had prescribed for diverticulitis sent to [facility] in [place].  She stated that she is in a lot of pain.

## 2018-07-25 NOTE — Telephone Encounter (Signed)
Patient did not answer my call. I left details on her voicemail. Rx to the pharmacy.

## 2018-07-25 NOTE — Telephone Encounter (Signed)
If continues to have persistent pain with no improvement, will need to come to ER for evaluation

## 2018-10-11 ENCOUNTER — Telehealth: Payer: Self-pay | Admitting: *Deleted

## 2018-10-11 NOTE — Telephone Encounter (Signed)
Covid-19 screening questions   Do you now or have you had a fever in the last 14 days?  Do you have any respiratory symptoms of shortness of breath or cough now or in the last 14 days?  Do you have any family members or close contacts with diagnosed or suspected Covid-19 in the past 14 days?  Have you been tested for Covid-19 and found to be positive?       

## 2018-10-12 NOTE — Telephone Encounter (Signed)
PT DECIDED [place]

## 2018-10-12 NOTE — Telephone Encounter (Signed)
Pt stated that she is returning your call.  °

## 2018-10-13 ENCOUNTER — Ambulatory Visit: Payer: BC Managed Care – PPO | Admitting: Gastroenterology

## 2018-10-18 ENCOUNTER — Other Ambulatory Visit: Payer: Self-pay

## 2018-10-18 ENCOUNTER — Telehealth: Payer: Self-pay | Admitting: Gastroenterology

## 2018-10-18 MED ORDER — CIPROFLOXACIN HCL 500 MG PO TABS: 500.0000 mg | ORAL_TABLET | Freq: Two times a day (BID) | ORAL | 0 refills | Status: AC

## 2018-10-18 MED ORDER — METRONIDAZOLE 500 MG PO TABS: 500.0000 mg | ORAL_TABLET | Freq: Three times a day (TID) | ORAL | 0 refills | Status: AC

## 2018-10-18 NOTE — Telephone Encounter (Signed)
Reports she started having a sharp stabbing pain across her lower abdomen on Sunday. She went to bed hours early because she did not feel well. She is having daily bowel movements with the Linzess but feel constipated and states the stool is hard with lots of mucous. Her abdominal pains come and go. She feels nauseated, bloated and has chills. Has not taken her temperature. Her appointment is end of August.

## 2018-10-18 NOTE — Telephone Encounter (Signed)
Saw Anderson Malta last. 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 Did Dr Silverio Decamp agree to take her?  Confusing.

## 2018-10-18 NOTE — Telephone Encounter (Signed)
Spoke with the patient again. She states this is definitely similar to previous bouts of diverticulitis. Agrees to antibiotics. Discussed low residue diet, abstaining from alcohol and monitoring her temperature. She has dicyclomine on hand and will use this for abdominal cramps. She may take Miralax to soften the stools. Follow up is scheduled.

## 2018-10-18 NOTE — Telephone Encounter (Signed)
She did transfer to Dr Silverio Decamp in 2019.  She called in April with diverticulitis flare and Dr Silverio Decamp treated her and was also scheduled to have a telehealth visit with her on 8/28.

## 2018-10-18 NOTE — Telephone Encounter (Signed)
Pt reported that she is taking Linzess and still having difficulty.  She stated that she is experiencing sharp abd p and nausea.  Pt said that her BMs are not completely emptying.  She would like to discuss an antibiotic.  Please advise.

## 2018-10-18 NOTE — Telephone Encounter (Signed)
If she feels the pain is similar to what she had with diverticulitis, will empirically treat with ciprofloxacin 500 mg twice daily and Flagyl 500 mg 3 times daily 7-day course.  Please advise patient to call back if her pain gets worse, will obtain CT abdomen and pelvis with contrast.  Schedule follow-up visit next available.

## 2018-10-23 ENCOUNTER — Other Ambulatory Visit: Payer: Self-pay

## 2018-10-23 ENCOUNTER — Telehealth: Payer: Self-pay | Admitting: Gastroenterology

## 2018-10-23 ENCOUNTER — Other Ambulatory Visit (INDEPENDENT_AMBULATORY_CARE_PROVIDER_SITE_OTHER): Payer: BC Managed Care – PPO

## 2018-10-23 DIAGNOSIS — R1030 Lower abdominal pain, unspecified: Secondary | ICD-10-CM

## 2018-10-23 LAB — CBC WITH DIFFERENTIAL/PLATELET
Basophils Absolute: 0 10*3/uL (ref 0.0–0.1)
Basophils Relative: 0.9 % (ref 0.0–3.0)
Eosinophils Absolute: 0.3 10*3/uL (ref 0.0–0.7)
Eosinophils Relative: 5.6 % — ABNORMAL HIGH (ref 0.0–5.0)
HCT: 39.1 % (ref 36.0–46.0)
Hemoglobin: 13.2 g/dL (ref 12.0–15.0)
Lymphocytes Relative: 44.7 % (ref 12.0–46.0)
Lymphs Abs: 2.3 10*3/uL (ref 0.7–4.0)
MCHC: 33.7 g/dL (ref 30.0–36.0)
MCV: 95.2 fl (ref 78.0–100.0)
Monocytes Absolute: 0.4 10*3/uL (ref 0.1–1.0)
Monocytes Relative: 8.5 % (ref 3.0–12.0)
Neutro Abs: 2.1 10*3/uL (ref 1.4–7.7)
Neutrophils Relative %: 40.3 % — ABNORMAL LOW (ref 43.0–77.0)
Platelets: 192 10*3/uL (ref 150.0–400.0)
RBC: 4.11 Mil/uL (ref 3.87–5.11)
RDW: 13.4 % (ref 11.5–15.5)
WBC: 5.1 10*3/uL (ref 4.0–10.5)

## 2018-10-23 LAB — COMPREHENSIVE METABOLIC PANEL
ALT: 20 U/L (ref 0–35)
AST: 24 U/L (ref 0–37)
Albumin: 4.4 g/dL (ref 3.5–5.2)
Alkaline Phosphatase: 69 U/L (ref 39–117)
BUN: 18 mg/dL (ref 6–23)
CO2: 29 mEq/L (ref 19–32)
Calcium: 9.7 mg/dL (ref 8.4–10.5)
Chloride: 106 mEq/L (ref 96–112)
Creatinine, Ser: 0.82 mg/dL (ref 0.40–1.20)
GFR: 72.54 mL/min (ref >60.00)
Glucose, Bld: 85 mg/dL (ref 70–99)
Potassium: 4.3 mEq/L (ref 3.5–5.1)
Sodium: 141 mEq/L (ref 135–145)
Total Bilirubin: 0.3 mg/dL (ref 0.2–1.2)
Total Protein: 6.8 g/dL (ref 6.0–8.3)

## 2018-10-23 NOTE — Telephone Encounter (Signed)
Please schedule patient for CBC, BMP and CT abdomen pelvis with contrast ASAP.  Further management based on CT findings. Follow-up office visit.  Thanks

## 2018-10-23 NOTE — Telephone Encounter (Signed)
Pt requested a call back to discuss symptoms.  Pt reported that she had to stop antibiotics due to itching.

## 2018-10-23 NOTE — Telephone Encounter (Signed)
Patient began itching on Saturday 24 hours after starting antibiotics. She stopped the atb due to concerns of an allergic reaction. She does not take any other medications. Stopped her MV Friday. She continues to have pain that she describes as off and on sharp shooting pain. The pain is located down the sides of her abdomen and sometimes in the middle. She feels pain when she walks. Afebrile. Stools are soft and green.

## 2018-10-23 NOTE — Telephone Encounter (Signed)
Patient is in agreement with this plan. Labs today. CT tomorrow at [facility] arrive 12:45 pm. She will pick up written instructions today when she comes for the labs.

## 2018-10-24 ENCOUNTER — Ambulatory Visit (HOSPITAL_COMMUNITY)
Admission: RE | Admit: 2018-10-24 | Discharge: 2018-10-24 | Disposition: A | Discharge status: Home / Self Care | Payer: BC Managed Care – PPO | Source: Ambulatory Visit | Attending: Gastroenterology | Admitting: Gastroenterology

## 2018-10-24 ENCOUNTER — Other Ambulatory Visit: Payer: Self-pay

## 2018-10-24 ENCOUNTER — Encounter (HOSPITAL_COMMUNITY): Payer: Self-pay

## 2018-10-24 DIAGNOSIS — R1030 Lower abdominal pain, unspecified: Secondary | ICD-10-CM | POA: Insufficient documentation

## 2018-10-24 MED ORDER — IOHEXOL 300 MG/ML  SOLN
100.0000 mL | Freq: Once | INTRAMUSCULAR | Status: AC | PRN
  Administered 2018-10-24: 13:00:00 100 mL via INTRAVENOUS

## 2018-11-01 ENCOUNTER — Other Ambulatory Visit: Payer: Self-pay | Admitting: Physician Assistant

## 2018-11-02 ENCOUNTER — Telehealth: Payer: Self-pay | Admitting: Gastroenterology

## 2018-11-02 MED ORDER — OMEPRAZOLE 20 MG PO CPDR: 20.0000 mg | DELAYED_RELEASE_CAPSULE | Freq: Every day | ORAL | 3 refills | Status: DC

## 2018-11-02 MED ORDER — LINACLOTIDE 72 MCG PO CAPS: 72.0000 ug | ORAL_CAPSULE | Freq: Every day | ORAL | 3 refills | Status: DC

## 2018-11-02 NOTE — Telephone Encounter (Signed)
Medication sent to pharmacy today.

## 2018-11-24 ENCOUNTER — Ambulatory Visit: Payer: BC Managed Care – PPO | Admitting: Gastroenterology

## 2019-01-17 ENCOUNTER — Other Ambulatory Visit: Payer: Self-pay

## 2019-01-17 DIAGNOSIS — Z20822 Contact with and (suspected) exposure to covid-19: Secondary | ICD-10-CM

## 2019-01-18 ENCOUNTER — Telehealth: Payer: Self-pay | Admitting: *Deleted

## 2019-01-18 NOTE — Telephone Encounter (Signed)
Called for covid19 results. Not available at this time. Continue to quarantine until you have the results. Call your pcp for any breathing concerns.

## 2019-01-19 ENCOUNTER — Telehealth: Payer: Self-pay

## 2019-01-19 LAB — NOVEL CORONAVIRUS, NAA: SARS-CoV-2, NAA: NOT DETECTED

## 2019-01-19 NOTE — Telephone Encounter (Signed)
Patient called requesting her COVID-19 test result. She was informed that her test was active but still not resulted. She verbalized understanding and will call back.

## 2019-03-09 ENCOUNTER — Telehealth: Payer: Self-pay | Admitting: Gastroenterology

## 2019-03-09 NOTE — Telephone Encounter (Signed)
Pt asked to please disregard previous message.  She thinks that it is her ovaries.

## 2019-03-26 ENCOUNTER — Telehealth: Payer: Self-pay | Admitting: Gastroenterology

## 2019-03-26 NOTE — Telephone Encounter (Signed)
Patient scheduled to see AE on 12/30 at 3:30 pm for abd pain and bloating.

## 2019-03-28 ENCOUNTER — Other Ambulatory Visit: Payer: Self-pay

## 2019-03-28 ENCOUNTER — Other Ambulatory Visit (INDEPENDENT_AMBULATORY_CARE_PROVIDER_SITE_OTHER): Payer: BC Managed Care – PPO

## 2019-03-28 ENCOUNTER — Encounter: Payer: Self-pay | Admitting: Physician Assistant

## 2019-03-28 ENCOUNTER — Ambulatory Visit: Payer: BC Managed Care – PPO | Admitting: Physician Assistant

## 2019-03-28 VITALS — BP 108/64 | HR 68 | Temp 98.0°F | Ht 63.0 in | Wt 162.0 lb

## 2019-03-28 DIAGNOSIS — R194 Change in bowel habit: Secondary | ICD-10-CM

## 2019-03-28 DIAGNOSIS — K5730 Diverticulosis of large intestine without perforation or abscess without bleeding: Secondary | ICD-10-CM

## 2019-03-28 DIAGNOSIS — Z86010 Personal history of colonic polyps: Secondary | ICD-10-CM

## 2019-03-28 DIAGNOSIS — R1011 Right upper quadrant pain: Secondary | ICD-10-CM

## 2019-03-28 DIAGNOSIS — Z1159 Encounter for screening for other viral diseases: Secondary | ICD-10-CM

## 2019-03-28 DIAGNOSIS — K589 Irritable bowel syndrome without diarrhea: Secondary | ICD-10-CM

## 2019-03-28 DIAGNOSIS — K5792 Diverticulitis of intestine, part unspecified, without perforation or abscess without bleeding: Secondary | ICD-10-CM | POA: Insufficient documentation

## 2019-03-28 LAB — CBC WITH DIFFERENTIAL/PLATELET
Basophils Absolute: 0 10*3/uL (ref 0.0–0.1)
Basophils Relative: 0.6 % (ref 0.0–3.0)
Eosinophils Absolute: 0.1 10*3/uL (ref 0.0–0.7)
Eosinophils Relative: 2.2 % (ref 0.0–5.0)
HCT: 39.8 % (ref 36.0–46.0)
Hemoglobin: 13.6 g/dL (ref 12.0–15.0)
Lymphocytes Relative: 41.2 % (ref 12.0–46.0)
Lymphs Abs: 2.3 10*3/uL (ref 0.7–4.0)
MCHC: 34.2 g/dL (ref 30.0–36.0)
MCV: 94.8 fl (ref 78.0–100.0)
Monocytes Absolute: 0.5 10*3/uL (ref 0.1–1.0)
Monocytes Relative: 8.8 % (ref 3.0–12.0)
Neutro Abs: 2.6 10*3/uL (ref 1.4–7.7)
Neutrophils Relative %: 47.2 % (ref 43.0–77.0)
Platelets: 240 10*3/uL (ref 150.0–400.0)
RBC: 4.2 Mil/uL (ref 3.87–5.11)
RDW: 13.7 % (ref 11.5–15.5)
WBC: 5.5 10*3/uL (ref 4.0–10.5)

## 2019-03-28 LAB — URINALYSIS, ROUTINE W REFLEX MICROSCOPIC
Bilirubin Urine: NEGATIVE
Hgb urine dipstick: NEGATIVE
Leukocytes,Ua: NEGATIVE
Nitrite: NEGATIVE
Specific Gravity, Urine: 1.02 (ref 1.000–1.030)
Urine Glucose: NEGATIVE
Urobilinogen, UA: 1 (ref 0.0–1.0)
pH: 7.5 (ref 5.0–8.0)

## 2019-03-28 LAB — COMPREHENSIVE METABOLIC PANEL
ALT: 18 U/L (ref 0–35)
AST: 22 U/L (ref 0–37)
Albumin: 4.2 g/dL (ref 3.5–5.2)
Alkaline Phosphatase: 77 U/L (ref 39–117)
BUN: 15 mg/dL (ref 6–23)
CO2: 29 mEq/L (ref 19–32)
Calcium: 9.4 mg/dL (ref 8.4–10.5)
Chloride: 103 mEq/L (ref 96–112)
Creatinine, Ser: 0.87 mg/dL (ref 0.40–1.20)
GFR: 67.64 mL/min (ref >60.00)
Glucose, Bld: 94 mg/dL (ref 70–99)
Potassium: 3.7 mEq/L (ref 3.5–5.1)
Sodium: 139 mEq/L (ref 135–145)
Total Bilirubin: 0.3 mg/dL (ref 0.2–1.2)
Total Protein: 6.7 g/dL (ref 6.0–8.3)

## 2019-03-28 LAB — SEDIMENTATION RATE: Sed Rate: 18 mm/hr (ref 0–30)

## 2019-03-28 MED ORDER — DICYCLOMINE HCL 10 MG PO CAPS: 10.0000 mg | ORAL_CAPSULE | Freq: Two times a day (BID) | ORAL | 1 refills | Status: DC

## 2019-03-28 NOTE — Patient Instructions (Addendum)
If you are age 54 or older, your body mass index should be between 23-30. Your Body mass index is 28.7 kg/m. If this is out of the aforementioned range listed, please consider follow up with your Primary Care Provider.  If you are age 57 or younger, your body mass index should be between 19-25. Your Body mass index is 28.7 kg/m. If this is out of the aformentioned range listed, please consider follow up with your Primary Care Provider.   We have sent the following medications to your pharmacy for you to pick up at your convenience:  Bentyl 10 mg twice a day  Please use IBguard as needed- you have been given samples Please start Mirilax 17 grams in 8 ounces of water. May continue linzess if needed   Covid-19 screening questions   Do you now or have you had a fever in the last 14 days?  Do you have any respiratory symptoms of shortness of breath or cough now or in the last 14 days?  Do you have any family members or close contacts with diagnosed or suspected Covid-19 in the past 14 days?  Have you been tested for Covid-19 and found to be positive?     Due to recent COVID-19 restrictions implemented by [employer] and state authorities and in an effort to keep both patients and staff as safe as possible, [facility] requires COVID-19 testing prior to any scheduled endoscopic procedure. The testing center is located at [address] [place], [place] [address] in the [facility] [employer]  suite.  Your appointment has been scheduled for 04/16/2019 at 8:00am.   Please bring your insurance cards to this appointment. You will require your COVID screen 2 business days prior to your endoscopic procedure.  You are not required to quarantine after your screening.  You will only receive a phone call with the results if it is POSITIVE.  If you do not receive a call the day before your procedure you should begin your prep, if ordered, and you  should report to the endo center for your procedure at your designated appointment arrival time ( one hour prior to the procedure time). There is no cost to you for the screening on the day of the swab.  [facility] will file with your insurance company for the testing.    You may receive an automated phone call prior to your procedure or have a message in your MyChart that you have an appointment for a BP/15 at the [facility], please disregard this message.  Your testing will be at the [place] [place] location.   If you are leaving [facility] travel [place] on [address], turn left onto [facility] [address] [facility] [address], turn night onto [address] at the 1st stop light turn right, pass the [employer] on your right and proceed to [place] (white building).   Abdominal Bloating When you have abdominal bloating, your abdomen may feel full, tight, or painful. It may also look bigger than normal or swollen (distended). Common causes of abdominal bloating include:  Swallowing air.  Constipation.  Problems digesting food.  Eating too much.  Irritable bowel syndrome. This is a condition that affects the large intestine.  Lactose intolerance. This is an inability to digest lactose, a natural sugar in dairy products.  Celiac disease. This is a condition that affects the ability to digest gluten, a protein found in some  grains.  Gastroparesis. This is a condition that slows down the movement of food in the stomach and small intestine. It is more common in people with diabetes mellitus.  Gastroesophageal reflux disease (GERD). This is a digestive condition that makes stomach acid flow back into the esophagus.  Urinary retention. This means that the body is holding onto urine, and the bladder cannot be emptied all the way. Follow these instructions at home: Eating and drinking  Avoid eating too  much.  Try not to swallow air while talking or eating.  Avoid eating while lying down.  Avoid these foods and drinks: ? Foods that cause gas, such as broccoli, cabbage, cauliflower, and baked beans. ? Carbonated drinks. ? Hard candy. ? Chewing gum. Medicines  Take over-the-counter and prescription medicines only as told by your health care provider.  Take probiotic medicines. These medicines contain live bacteria or yeasts that can help digestion.  Take coated peppermint oil capsules. Activity  Try to exercise regularly. Exercise may help to relieve bloating that is caused by gas and relieve constipation. General instructions  Keep all follow-up visits as told by your health care provider. This is important. Contact a health care provider if:  You have nausea and vomiting.  You have diarrhea.  You have abdominal pain.  You have unusual weight loss or weight gain.  You have severe pain, and medicines do not help. Get help right away if:  You have severe chest pain.  You have trouble breathing.  You have shortness of breath.  You have trouble urinating.  You have darker urine than normal.  You have blood in your stools or have dark, tarry stools. Summary  Abdominal bloating means that the abdomen is swollen.  Common causes of abdominal bloating are swallowing air, constipation, and problems digesting food.  Avoid eating too much and avoid swallowing air.  Avoid foods that cause gas, carbonated drinks, hard candy, and chewing gum. This information is not intended to replace advice given to you by your health care provider. Make sure you discuss any questions you have with your health care provider. Document Released: 04/16/2016 Document Revised: 07/03/2018 Document Reviewed: 04/16/2016 Elsevier Patient Education  2020 [employer].   Due to recent changes in healthcare laws, you may see the results of your imaging and laboratory studies on MyChart before  your provider has had a chance to review them.  We understand that in some cases there may be results that are confusing or concerning to you. Not all laboratory results come back in the same time frame and the provider may be waiting for multiple results in order to interpret others.  Please give [place] 48 hours in order for your provider to thoroughly review all the results before contacting the office for clarification of your results.

## 2019-03-28 NOTE — Progress Notes (Signed)
Subjective:    Patient ID: [name], female    DOB: December 10, 1965, 53 y.o.   MRN: [id number]  HPI [name] is a pleasant 53 year old white female, established previously with Dr. Ardis Hughs.  Apparently she had requested to be established with a female provider and was switched to Dr. Silverio Decamp, but has never been seen by Dr. Silverio Decamp. Today she request to be reestablished with Dr. Ardis Hughs.  She says her husband had wanted her to switch to a female provider but she feels that Dr. Ardis Hughs took very good care of her in the past and would like to stay with Dr. Ardis Hughs.  She has history of GERD, IBS, proctalgia fugax, anxiety and diverticular disease.  She had a sessile serrated polyp in 2013.  Follow-up colonoscopy in February 2017 showed no recurrent polyps she did have moderate diverticulosis of the left colon with luminal narrowing.  EGD was done at that same time and normal.  She comes in today with complaints of right lower quadrant pain which has been bothering her over the past 2 to 3 weeks consistently.  She says she was seen by her gynecologist earlier today and was told that this was not GYN or ovarian in nature.  She says that she has been having sharp stabbing pains in the right lower quadrant which started the week before Christmas and actually had doubled her over a couple of times.  Pain is not been as bad over the past couple of weeks but persists that has been associated with a lot of bloating and tightness in her abdomen.  She also feels that her stools have been more narrow and small despite use of Linzess.  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  The Linzess does not always work even when taking it more consistently. She has not had any nausea or vomiting, appetite has been  okay.  No fever.  She has not noted any melena or hematochezia.  Denies any dysuria but has had some urinary frequency.  No history of kidney stones. She has a prescription for Bentyl but only uses as needed for rectal spasms. She admits to being very anxious about her current symptoms and is scared to death of colon cancer.  She also mentions that she has been having pain with intercourse recently with general pelvic discomfort not just on the right side.  She is status post hysterectomy and left oophorectomy.  Review of Systems Pertinent positive and negative review of systems were noted in the above HPI section.  All other review of systems was otherwise negative.  Outpatient Encounter Medications as of 03/28/2019  Medication Sig  . AMBULATORY NON FORMULARY MEDICATION Take 1 capsule by mouth every morning. Medication Name: IBGard  . aspirin-acetaminophen-caffeine (EXCEDRIN MIGRAINE) 250-250-65 MG per tablet Take 1 tablet by mouth as needed.  . linaclotide (LINZESS) 72 MCG capsule Take 1 capsule (72 mcg total) by mouth daily before breakfast.  . Multiple Vitamin (MULTIVITAMIN) tablet Take 1 tablet by mouth daily.  Marland Kitchen omeprazole (PRILOSEC) 20 MG capsule Take 1 capsule (20 mg total) by mouth daily.  . rizatriptan (MAXALT) 10 MG tablet Take 10 mg by mouth as needed. May repeat in 2 hours if needed  . [DISCONTINUED] dicyclomine (BENTYL) 10  MG capsule Take 1 tablet by mouth three times daily 20-30 minutes before meals  . dicyclomine (BENTYL) 10 MG capsule Take 1 capsule (10 mg total) by mouth 2 (two) times daily.   No facility-administered encounter medications on file as of 03/28/2019.   Allergies  Allergen Reactions  . Morphine And Related     Rash,SOB,whelts  . Ciprofloxacin Itching  . Codeine     rash  . Depo-Medrol [Methylprednisolone Sodium Succ]     Rash, anxiety  . Flagyl [Metronidazole] Itching   Patient Active Problem List   Diagnosis Date Noted  . Diverticulosis of colon  without hemorrhage 03/28/2019  . Bloating 07/07/2016  . Constipation 07/07/2016  . 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 Lower gastrointestinal bleed 02/22/2012  . Bowel habit changes 02/22/2012  . Gas pain 02/22/2012  . CHEST PAIN 01/14/2009  . ANXIETY STATE, UNSPECIFIED 01/13/2009  . MIGRAINE HEADACHE 01/13/2009  . UNSPECIFIED BACKACHE 01/13/2009  . PROCTALGIA FUGAX 05/15/2008  . GERD 05/14/2008  . IRRITABLE BOWEL SYNDROME 05/14/2008  . FATIGUE 05/14/2008   Social History   Socioeconomic History  . Marital status: Married    Spouse name: Not on file  . Number of children: 3  . Years of education: Not on file  . Highest education level: Not on file  Occupational History    Employer: [employer]  Tobacco Use  . Smoking status: Never Smoker  . Smokeless tobacco: Never Used  Substance and Sexual Activity  . Alcohol use: No    Alcohol/week: 0.0 standard drinks  . Drug use: No  . Sexual activity: Not on file  Other Topics Concern  . Not on file  Social History Narrative   About 2 caffeinated beverages daily   Social Determinants of Health   Financial Resource Strain:   . Difficulty of Paying Living Expenses: Not on file  Food Insecurity:   . Worried About Charity fundraiser in the Last Year: Not on file  . Ran Out of Food in the Last Year: Not on file  Transportation Needs:   . Lack of Transportation (Medical): Not on file  . Lack of Transportation (Non-Medical): Not on file  Physical Activity:   . Days of Exercise per Week: Not on file  . Minutes of Exercise per Session: Not on file  Stress:   . Feeling of Stress : Not on file  Social Connections:   . Frequency of Communication with Friends and Family: Not on file  . Frequency of Social Gatherings with Friends and Family: Not on file  . Attends Religious Services: Not on file  . Active Member of Clubs or Organizations: Not on file  . Attends Archivist Meetings: Not on file  .  Marital Status: Not on file  Intimate Partner Violence:   . Fear of Current or Ex-Partner: Not on file  . Emotionally Abused: Not on file  . Physically Abused: Not on file  . Sexually Abused: Not on file    Ms. Dulay's family history includes COPD in her father; Colon cancer in her cousin and paternal aunt; Diabetes in her maternal grandfather; Irritable bowel syndrome in her son; Leukemia in her cousin; Ovarian cancer in her cousin; Prostate cancer in her paternal uncle.      Objective:    Vitals:   03/28/19 1526  BP: 108/64  Pulse: 68  Temp: 98 F (36.7 C)    Physical Exam.Well-developed well-nourished female in no acute distress. , VOHYWV371, BMI28.7  HEENT; nontraumatic normocephalic,  EOMI, PER R LA, sclera anicteric. Oropharynx; not examined/mask/Covid Neck; supple, no JVD Cardiovascular; regular rate and rhythm with S1-S2, no murmur rub or gallop Pulmonary; Clear bilaterally Abdomen; soft,  nondistended, she is tender in the right mid and right lower quadrant, and also has mild tenderness across the upper abdomen and into the left upper quadrant, no palpable mass or hepatosplenomegaly, bowel sounds are active Rectal; not done today Skin; benign exam, no jaundice rash or appreciable lesions Extremities; no clubbing cyanosis or edema skin warm and dry Neuro/Psych; alert and oriented x4, grossly nonfocal mood and affect appropriate, anxious       Assessment & Plan:   #3 53 year old white female with history of IBS and diverticular disease.  She was noted to have some luminal narrowing in the left colon at the time of colonoscopy 2017. Presents now with 2 to 3-week history of sharp right lower quadrant pain, intermittent, change in bowel habits with smaller narrower stools, increased bloating and gas.  Etiology of symptoms is not entirely clear, this may be all due to exacerbation of IBS, patient is extremely worried about cancer.  She may also have intra-abdominal  adhesions. CT of the abdomen and pelvis in July 2020 was unremarkable Current symptoms not consistent with diverticulitis  Plan; CBC with differential, c-Met, sed rate, UA We discussed repeat imaging with CT of the abdomen and pelvis versus colonoscopy.  Patient would like to proceed with colonoscopy.  This will be scheduled with Dr. Ardis Hughs.  Procedure was discussed in detail with the patient including indications risks and benefits and she is agreeable to proceed.  Start low gas diet, continue lactose-free diet Advised her to start Bentyl 10 mg p.o. twice daily to be taken regularly and not as needed Continue IBgard as needed Start MiraLAX 17 g in 8 ounces of water every day.   Amy Genia Harold PA-C 03/28/2019   Cc: Monico Blitz, MD

## 2019-03-29 LAB — SARS-COV-2 IGG: SARS-COV-2 IgG: 0.02

## 2019-03-29 NOTE — Progress Notes (Signed)
I agree with the above note, plan 

## 2019-04-02 ENCOUNTER — Telehealth: Payer: Self-pay | Admitting: Physician Assistant

## 2019-04-02 NOTE — Telephone Encounter (Signed)
Patient has lost her password to her My Chart. She cannot see her labs results. Advised of normal lab results. The patient states she has declined imaging and the colonoscopy for financial reasons. States she is following the plan as discussed at her recent office visit. States her symptoms have not worsened or changed. She will let us know if she is able to go forward with further testing or acutely worsens.

## 2019-04-02 NOTE — Telephone Encounter (Signed)
Ok, thanks.

## 2019-04-18 ENCOUNTER — Encounter: Payer: BC Managed Care – PPO | Admitting: Gastroenterology

## 2019-04-19 ENCOUNTER — Other Ambulatory Visit: Payer: Self-pay | Admitting: Physician Assistant

## 2019-05-02 ENCOUNTER — Ambulatory Visit: Payer: BC Managed Care – PPO | Attending: Internal Medicine

## 2019-05-02 ENCOUNTER — Other Ambulatory Visit: Payer: Self-pay

## 2019-05-02 DIAGNOSIS — Z20822 Contact with and (suspected) exposure to covid-19: Secondary | ICD-10-CM

## 2019-05-03 LAB — NOVEL CORONAVIRUS, NAA: SARS-CoV-2, NAA: NOT DETECTED

## 2019-05-04 ENCOUNTER — Telehealth: Payer: Self-pay | Admitting: *Deleted

## 2019-05-04 NOTE — Telephone Encounter (Signed)
Pt given result of COVID test; she verbalized understanding; MyChart link sent via text per pt request; she confirmed receipt.

## 2019-06-03 ENCOUNTER — Ambulatory Visit: Payer: BC Managed Care – PPO | Attending: Internal Medicine

## 2019-06-03 DIAGNOSIS — Z23 Encounter for immunization: Secondary | ICD-10-CM | POA: Insufficient documentation

## 2019-06-03 NOTE — Progress Notes (Signed)
   Covid-19 Vaccination Clinic  Name:  [name]    MRN: [id number] DOB: Jul 14, 1965  06/03/2019  [name] was observed post Covid-19 immunization for 30 minutes based on pre-vaccination screening without incident. She was provided with Vaccine Information Sheet and instruction to access the V-Safe system.   [name] was instructed to call 911 with any severe reactions post vaccine: Marland Kitchen Difficulty breathing  . Swelling of face and throat  . A fast heartbeat  . A bad rash all over body  . Dizziness and weakness   Immunizations Administered    Name Date Dose VIS Date Route   Pfizer COVID-19 Vaccine 06/03/2019  3:45 PM 0.3 mL 03/09/2019 Intramuscular   Manufacturer: [place]   Lot: [id number]   [place]: [id number]

## 2019-06-24 ENCOUNTER — Ambulatory Visit: Payer: BC Managed Care – PPO | Attending: Internal Medicine

## 2019-06-24 DIAGNOSIS — Z23 Encounter for immunization: Secondary | ICD-10-CM

## 2019-06-24 NOTE — Progress Notes (Signed)
   Covid-19 Vaccination Clinic  Name:  [name]    MRN: [id number] DOB: 11-28-1965  06/24/2019  [name] was observed post Covid-19 immunization for 30 minutes based on pre-vaccination screening without incident. She was provided with Vaccine Information Sheet and instruction to access the V-Safe system.   [name] was instructed to call 911 with any severe reactions post vaccine: Marland Kitchen Difficulty breathing  . Swelling of face and throat  . A fast heartbeat  . A bad rash all over body  . Dizziness and weakness   Immunizations Administered    Name Date Dose VIS Date Route   Pfizer COVID-19 Vaccine 06/24/2019  1:53 PM 0.3 mL 03/09/2019 Intramuscular   Manufacturer: [place]   Lot: [id number]   [place]: [id number]

## 2019-09-03 ENCOUNTER — Other Ambulatory Visit: Payer: Self-pay | Admitting: Gastroenterology

## 2019-09-19 ENCOUNTER — Telehealth: Payer: Self-pay | Admitting: Gastroenterology

## 2019-09-19 NOTE — Telephone Encounter (Signed)
Spoke with patient, pt reports bloating "looks like i'm pregnant" , pt states that she is tender all over her abdomen, pt reports really sharp pain "like someone is stabbing me", pt reports rectal spasms as well. Pt states that she has been nauseated for about a month, pt states that she is taking Linzess everyday, it has started to help but pt is still in pain. Pt states that she thinks it is a flare up of diverticulitis but pain is not only on the left side. Please advise, thank you. Cipro and Flagyl under allergy - causes itching   Call cell phone if can't get pt on house phone

## 2019-09-19 NOTE — Telephone Encounter (Signed)
Patient called states she is having a diverticulitis flare up please advise

## 2019-09-20 ENCOUNTER — Other Ambulatory Visit: Payer: Self-pay

## 2019-09-20 DIAGNOSIS — K5730 Diverticulosis of large intestine without perforation or abscess without bleeding: Secondary | ICD-10-CM

## 2019-09-20 MED ORDER — AMOXICILLIN-POT CLAVULANATE 875-125 MG PO TABS: 1.0000 | ORAL_TABLET | Freq: Two times a day (BID) | ORAL | 0 refills | Status: AC

## 2019-09-20 NOTE — Telephone Encounter (Signed)
Her symptoms appear to be less consistent with acute diverticulitis. Please advise patient to stay on liquid diet for next 1-2 days, bowel purge with Miralax as tolerated and then slowly advance diet to soft.  Please send Rx for Augmentin 875 mg BID X5 days if no penicillin allergy and instruct patient to take it only if her symptoms don't improve after bowel purge Follow up office visit next available with APP or me. Thanks

## 2019-09-20 NOTE — Telephone Encounter (Signed)
Spoke with patient regarding recommendations, pt advised to stay on liquid diet for 1-2 days, pt advised to bowel purge with Miralax as tolerated. Pt advised to mix 7 cap fulls of Miralax in 36 oz of Gatorade and to drink one full cup every 15 minutes until all gone. Pt advised to slowly advance to soft diet after.  Pt advised that prescription for Augmentin has been sent in to her pharmacy but to only begin taking it if bowel purge does not help her symptoms. Pt scheduled follow up for 10/31/19 at 11 am with Dr. Silverio Decamp. Patient states that she is thankful for our help and for getting her in before she goes back to work in August.

## 2019-09-21 ENCOUNTER — Telehealth: Payer: Self-pay | Admitting: Gastroenterology

## 2019-09-21 NOTE — Telephone Encounter (Signed)
Please advise patient to follow instructions for bowel purge to help with constipation but if her symptoms are progressively worsening she will need to come to ER for further evaluation.

## 2019-09-21 NOTE — Telephone Encounter (Signed)
Spoke with the patient. She has begun her purge today. She complains of pain in her shoulders and down her back. Admits that this is causing her anxiety because she is worried that her colon will perforate. She states this has been going on for a long time. She can eat a bowel of cereal and her stomach swells. She asks about surgery to remove her colon and asks how they decide. Told the patient that she is getting ahead of her current issues. Afebrile. Abdominal discomfort is generalized.

## 2019-09-21 NOTE — Telephone Encounter (Signed)
Patient has been advised to go forward with her instruction on the purge. She says she is doing that right now.

## 2019-10-31 ENCOUNTER — Ambulatory Visit: Payer: BC Managed Care – PPO | Admitting: Gastroenterology

## 2019-10-31 ENCOUNTER — Encounter: Payer: Self-pay | Admitting: Gastroenterology

## 2019-10-31 VITALS — BP 124/70 | HR 60 | Ht 63.0 in | Wt 164.4 lb

## 2019-10-31 DIAGNOSIS — K6389 Other specified diseases of intestine: Secondary | ICD-10-CM | POA: Diagnosis not present

## 2019-10-31 DIAGNOSIS — K219 Gastro-esophageal reflux disease without esophagitis: Secondary | ICD-10-CM | POA: Diagnosis not present

## 2019-10-31 DIAGNOSIS — R140 Abdominal distension (gaseous): Secondary | ICD-10-CM

## 2019-10-31 DIAGNOSIS — K5902 Outlet dysfunction constipation: Secondary | ICD-10-CM

## 2019-10-31 DIAGNOSIS — K581 Irritable bowel syndrome with constipation: Secondary | ICD-10-CM

## 2019-10-31 MED ORDER — DICYCLOMINE HCL 20 MG PO TABS: 20.0000 mg | ORAL_TABLET | Freq: Three times a day (TID) | ORAL | 2 refills | Status: DC | PRN

## 2019-10-31 MED ORDER — LINACLOTIDE 145 MCG PO CAPS: 145.0000 ug | ORAL_CAPSULE | Freq: Every day | ORAL | 3 refills | Status: DC

## 2019-10-31 NOTE — Progress Notes (Signed)
[name]    [id number]    December 13, 1965  Primary Care Physician:Shah, Weldon Picking, MD  Referring Physician: Monico Blitz, Gordon Gordon,   [id number]   Chief complaint:  Epigastric pain, bloating, constipation  HPI: 54 year old very pleasant female here for follow-up visit with complaints of severe abdominal bloating, epigastric discomfort radiating to both sides and upper abdomen and also constipation.  On most days she passes hard stool like marbles, has intermittent severe rectal spasm and occasional leakage/seepage when she coughs or sneezes  She has left-sided diverticulosis, was empirically treated with Augmentin for possible diverticulitis Transient improvement of abdominal bloating after antibiotics  She also was given MiraLAX bowel purge.  She is taking Linzess 72 mcg daily with persistent symptoms of constipation.  She is unable to evacuate completely. S/p hysterectomy, had adhesions s/p BSO  No rectal bleeding or melena  Colonoscopy February 2017 by Dr. Ardis Hughs showed diverticulosis in the left colon otherwise unremarkable exam EGD February 2017: Unremarkable   Outpatient Encounter Medications as of 10/31/2019  Medication Sig  . AMBULATORY NON FORMULARY MEDICATION Take 1 capsule by mouth every morning. Medication Name: IBGard  . aspirin-acetaminophen-caffeine (EXCEDRIN MIGRAINE) 250-250-65 MG per tablet Take 1 tablet by mouth as needed.  . dicyclomine (BENTYL) 10 MG capsule TAKE 1 CAPSULE (10 MG TOTAL) BY MOUTH 2 (TWO) TIMES DAILY. (Patient taking differently: Take 10 mg by mouth 2 (two) times daily as needed. )  . LINZESS 72 MCG capsule TAKE 1 CAPSULE (72 MCG TOTAL) BY MOUTH DAILY BEFORE BREAKFAST.  . Multiple Vitamin (MULTIVITAMIN) tablet Take 1 tablet by mouth daily.  Marland Kitchen omeprazole (PRILOSEC) 20 MG capsule Take 1 capsule (20 mg total) by mouth daily.  . rizatriptan (MAXALT) 10 MG tablet Take 10 mg by mouth as needed. May repeat in 2 hours if  needed   No facility-administered encounter medications on file as of 10/31/2019.    Allergies as of 10/31/2019 - Review Complete 10/31/2019  Allergen Reaction Noted  . Morphine and related  02/22/2012  . Ciprofloxacin Itching 03/28/2019  . Codeine  02/22/2012  . Depo-medrol [methylprednisolone sodium succ]  03/01/2012  . Flagyl [metronidazole] Itching 03/28/2019    Past Medical History:  Diagnosis Date  . Abdominal pain   . Chest pain 10.7.2010   stress echo...abnormal  . Colon polyp   . Constipation   . Depression   . Diverticulitis   . Diverticulosis   . GERD (gastroesophageal reflux disease)   . Hemorrhoid   . History of panic attacks   . HOH (hard of hearing)    left ear  . Hyperventilation   . IBS (irritable bowel syndrome)   . Interstitial cystitis   . Maxillary sinusitis   . Migraines   . Seasonal allergies   . Urinary tract bacterial infections     Past Surgical History:  Procedure Laterality Date  . LAPAROSCOPY     abd  . SALPINGECTOMY    . SPINAL FUSION    . TUBAL LIGATION    . VAGINAL HYSTERECTOMY      Family History  Problem Relation Age of Onset  . Colon cancer Paternal Aunt        x2, cousin  . COPD Father   . Ovarian cancer Cousin        mat side  . Colon cancer Cousin   . Leukemia Cousin   . Diabetes Maternal Grandfather        daughter  .  Prostate cancer Paternal Uncle   . Irritable bowel syndrome Son     Social History   Socioeconomic History  . Marital status: Married    Spouse name: Not on file  . Number of children: 3  . Years of education: Not on file  . Highest education level: Not on file  Occupational History    Employer: [employer]  Tobacco Use  . Smoking status: Never Smoker  . Smokeless tobacco: Never Used  Vaping Use  . Vaping Use: Never used  Substance and Sexual Activity  . Alcohol use: No    Alcohol/week: 0.0 standard drinks  . Drug use: No  . Sexual activity: Not on file  Other Topics  Concern  . Not on file  Social History Narrative   About 2 caffeinated beverages daily   Social Determinants of Health   Financial Resource Strain:   . Difficulty of Paying Living Expenses:   Food Insecurity:   . Worried About Charity fundraiser in the Last Year:   . Arboriculturist in the Last Year:   Transportation Needs:   . Film/video editor (Medical):   Marland Kitchen Lack of Transportation (Non-Medical):   Physical Activity:   . Days of Exercise per Week:   . Minutes of Exercise per Session:   Stress:   . Feeling of Stress :   Social Connections:   . Frequency of Communication with Friends and Family:   . Frequency of Social Gatherings with Friends and Family:   . Attends Religious Services:   . Active Member of Clubs or Organizations:   . Attends Archivist Meetings:   Marland Kitchen Marital Status:   Intimate Partner Violence:   . Fear of Current or Ex-Partner:   . Emotionally Abused:   Marland Kitchen Physically Abused:   . Sexually Abused:       Review of systems: All other review of systems negative except as mentioned in the HPI.   Physical Exam: Vitals:   10/31/19 1104  BP: 124/70  Pulse: 60   Body mass index is 29.12 kg/m. 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: Advised patient to continue with high-fiber diet and increase water intake to 8 to 10 cups daily Add Benefiber 1 teaspoon twice daily with meals Increase Linzess to 145 mcg daily Use  squatty potty during defecation Instructions provided for Kegel exercises to improve anal sphincter tone If continues to have persistent difficulty completely evacuating, will consider anorectal manometry and pelvic floor physical therapy for biofeedback  Abdominal bloating: Lactulose breath test to exclude small intestinal bacterial overgrowth If positive will plan to treat with rifaximin for 14 days course  Abdominal discomfort and cramping secondary to dysmotility/IBS and significant intra-abdominal adhesions Use dicyclomine 20 mg every 8 hours as needed Recent imaging July 2020 unremarkable for acute pathology  GERD: Continue omeprazole and antireflux measures dyssynergic defecation  This visit required >40 minutes of patient care (this includes precharting, chart review, review of results,  face-to-face time used for counseling as well as treatment plan and follow-up. The patient was provided an opportunity to ask questions and all were answered. The patient agreed with the plan and demonstrated an understanding of the instructions.  Damaris Hippo , MD    CC: Monico Blitz, MD

## 2019-10-31 NOTE — Patient Instructions (Addendum)
We have sent Linzess 145 mcg, omeprazole and dicyclomine to your pharmacy  Continue Omeprazole   Take benefiber 1 teaspoon twice a day  Increase water intake to 8-10 cups of water daily  Use a Squatty Potty  You have been given a testing kit to check for small intestine bacterial overgrowth (SIBO) which is completed by a company named [facility]. Make sure to return your test in the mail using the return mailing label given to you along with the kit. Your demographic and insurance information have already been sent to the company and they should be in contact with you over the next week regarding this test. [facility] will collect an upfront charge of $99.74 for commercial insurance plans and $209.74 is you are paying cash. Make sure to discuss with [facility] PRIOR to having the test if they have gotten informatoin from your insurance company as to how much your testing will cost out of pocket, if any. Please keep in mind that you will be getting a call from phone number [phone] or a similar number. If you do not hear from them within this time frame, please call our office at [phone].    Kegel Exercises  Kegel exercises can help strengthen your pelvic floor muscles. The pelvic floor is a group of muscles that support your rectum, small intestine, and bladder. In females, pelvic floor muscles also help support the womb (uterus). These muscles help you control the flow of urine and stool. Kegel exercises are painless and simple, and they do not require any equipment. Your provider may suggest Kegel exercises to:  Improve bladder and bowel control.  Improve sexual response.  Improve weak pelvic floor muscles after surgery to remove the uterus (hysterectomy) or pregnancy (females).  Improve weak pelvic floor muscles after prostate gland removal or surgery (males). Kegel exercises involve squeezing your pelvic floor muscles, which are the same muscles you squeeze when  you try to stop the flow of urine or keep from passing gas. The exercises can be done while sitting, standing, or lying down, but it is best to vary your position. Exercises How to do Kegel exercises: 1. Squeeze your pelvic floor muscles tight. You should feel a tight lift in your rectal area. If you are a female, you should also feel a tightness in your vaginal area. Keep your stomach, buttocks, and legs relaxed. 2. Hold the muscles tight for up to 10 seconds. 3. Breathe normally. 4. Relax your muscles. 5. Repeat as told by your health care provider. Repeat this exercise daily as told by your health care provider. Continue to do this exercise for at least 4-6 weeks, or for as long as told by your health care provider. You may be referred to a physical therapist who can help you learn more about how to do Kegel exercises. Depending on your condition, your health care provider may recommend:  Varying how long you squeeze your muscles.  Doing several sets of exercises every day.  Doing exercises for several weeks.  Making Kegel exercises a part of your regular exercise routine. This information is not intended to replace advice given to you by your health care provider. Make sure you discuss any questions you have with your health care provider. Document Revised: 11/02/2017 Document Reviewed: 11/02/2017 Elsevier Patient Education  [place].  I appreciate the  opportunity to care for you  Thank You   Harl Bowie , MD

## 2019-11-05 ENCOUNTER — Other Ambulatory Visit: Payer: Self-pay

## 2020-05-12 ENCOUNTER — Other Ambulatory Visit: Payer: Self-pay | Admitting: Gastroenterology

## 2020-07-21 ENCOUNTER — Telehealth: Payer: Self-pay | Admitting: Gastroenterology

## 2020-07-21 NOTE — Telephone Encounter (Signed)
RLQ pain that comes and goes. Dyspareunia, pain when walking, has to "hold " the area sometimes. She was seen at her GYN. Vaginal u/s was done. Patient has a right ovary. She was told her pain was not GYN, but "from the colon." No constipation. Linzess is working well for her. Afebrile.  Appointment made for evaluation.

## 2020-07-21 NOTE — Telephone Encounter (Signed)
Inbound call from patient requesting a call from a nurse please.  States she is having RLQ pain.  Please advise.

## 2020-07-22 ENCOUNTER — Other Ambulatory Visit: Payer: Self-pay | Admitting: Gastroenterology

## 2020-07-22 ENCOUNTER — Ambulatory Visit: Payer: BC Managed Care – PPO | Admitting: Gastroenterology

## 2020-07-22 ENCOUNTER — Encounter: Payer: Self-pay | Admitting: Gastroenterology

## 2020-07-22 VITALS — BP 90/60 | HR 68 | Ht 63.0 in | Wt 156.0 lb

## 2020-07-22 DIAGNOSIS — K625 Hemorrhage of anus and rectum: Secondary | ICD-10-CM

## 2020-07-22 DIAGNOSIS — R1031 Right lower quadrant pain: Secondary | ICD-10-CM

## 2020-07-22 DIAGNOSIS — K5909 Other constipation: Secondary | ICD-10-CM

## 2020-07-22 MED ORDER — SUPREP BOWEL PREP KIT 17.5-3.13-1.6 GM/177ML PO SOLN: 1.0000 | Freq: Once | ORAL | 0 refills | Status: DC

## 2020-07-22 NOTE — Patient Instructions (Signed)
Dr Silverio Decamp recommends that you complete a bowel purge (to clean out your bowels). Please do the following: Purchase a bottle of Miralax over the counter as well as a box of 5 mg dulcolax tablets. Take 4 dulcolax tablets. Wait 1 hour. You will then drink 6-8 capfuls of Miralax mixed in an adequate amount of water/juice/gatorade (you may choose which of these liquids to drink) over the next 2-3 hours. You should expect results within 1 to 6 hours after completing the bowel purge.  Continue Linzess   Complete the Lactulose Breath test after colonoscopy that you already have , follow instructions on kit   Due to recent changes in healthcare laws, you may see the results of your imaging and laboratory studies on MyChart before your provider has had a chance to review them.  We understand that in some cases there may be results that are confusing or concerning to you. Not all laboratory results come back in the same time frame and the provider may be waiting for multiple results in order to interpret others.  Please give [place] 48 hours in order for your provider to thoroughly review all the results before contacting the office for clarification of your results.   If you are age 61 or older, your body mass index should be between 23-30. Your Body mass index is 27.63 kg/m. If this is out of the aforementioned range listed, please consider follow up with your Primary Care Provider.  If you are age 102 or younger, your body mass index should be between 19-25. Your Body mass index is 27.63 kg/m. If this is out of the aformentioned range listed, please consider follow up with your Primary Care Provider.    I appreciate the  opportunity to care for you  Thank You   Harl Bowie , MD

## 2020-07-22 NOTE — Progress Notes (Signed)
[name]    [id number]    25-Mar-1966  Primary Care Physician:Shah, Weldon Picking, MD  Referring Physician: Monico Blitz, [address] [place],  [place] [address]   Chief complaint:  Abdominal pain, rectal bleeding  HPI:  55 year old very pleasant female here for follow-up visit with complaints of severe abdominal bloating, and right lower quadrant abdominal pain  No association of abdominal pain with diet or bowel habits.  She did notice worsening after exercise and sometimes up to walking.  She is also having intermittent bright red blood per rectum with mucus She had left lower abdominal pain, has left-sided diverticulosis, was empirically treated with Augmentin for possible diverticulitis Transient improvement of abdominal bloating after antibiotics but no improvement of right lower quadrant abdominal pain  She continues to have intermittent constipation, is taking Linzess but is also having to do bowel purge as intermittently with some transient improvement of abdominal bloating.  S/p hysterectomy, had adhesions s/p resection of left ovary, she still has the right ovary.   Colonoscopy February 2017 by Dr. Ardis Hughs showed diverticulosis in the left colon otherwise unremarkable exam EGD February 2017: Unremarkable   Outpatient Encounter Medications as of 07/22/2020  Medication Sig  . AMBULATORY NON FORMULARY MEDICATION Take 1 capsule by mouth every morning. Medication Name: IBGard  . aspirin-acetaminophen-caffeine (EXCEDRIN MIGRAINE) 250-250-65 MG per tablet Take 1 tablet by mouth as needed.  . dicyclomine (BENTYL) 20 MG tablet Take 1 tablet (20 mg total) by mouth every 8 (eight) hours as needed for spasms.  Marland Kitchen linaclotide (LINZESS) 145 MCG CAPS capsule Take 1 capsule (145 mcg total) by mouth daily before breakfast.  . Multiple Vitamin (MULTIVITAMIN) tablet Take 1 tablet by mouth daily.  Marland Kitchen omeprazole (PRILOSEC) 20 MG capsule Take 1 capsule (20 mg total) by mouth  daily.  . rizatriptan (MAXALT) 10 MG tablet Take 10 mg by mouth as needed. May repeat in 2 hours if needed  . [DISCONTINUED] LINZESS 72 MCG capsule TAKE 1 CAPSULE (72 MCG TOTAL) BY MOUTH DAILY BEFORE BREAKFAST.  . [DISCONTINUED] Probiotic Product (ALIGN PO) Take 1 tablet by mouth daily.   No facility-administered encounter medications on file as of 07/22/2020.    Allergies as of 07/22/2020 - Review Complete 07/22/2020  Allergen Reaction Noted  . Morphine and related  02/22/2012  . Ciprofloxacin Itching 03/28/2019  . Codeine  02/22/2012  . Depo-medrol [methylprednisolone sodium succ]  03/01/2012  . Flagyl [metronidazole] Itching 03/28/2019    Past Medical History:  Diagnosis Date  . Abdominal pain   . Chest pain 10.7.2010   stress echo...abnormal  . Colon polyp   . Constipation   . Depression   . Diverticulitis   . Diverticulosis   . GERD (gastroesophageal reflux disease)   . Hemorrhoid   . History of panic attacks   . HOH (hard of hearing)    left ear  . Hyperventilation   . IBS (irritable bowel syndrome)   . Interstitial cystitis   . Maxillary sinusitis   . Migraines   . Seasonal allergies   . Urinary tract bacterial infections     Past Surgical History:  Procedure Laterality Date  . LAPAROSCOPY     abd  . SALPINGECTOMY    . SPINAL FUSION    . TUBAL LIGATION    . VAGINAL HYSTERECTOMY      Family History  Problem Relation Age of Onset  . Colon cancer  Paternal Aunt        x2  . COPD Father   . Ovarian cancer Cousin        mat side  . Colon cancer Cousin   . Thyroid disease Mother   . Leukemia Cousin   . Diabetes Maternal Grandfather   . Prostate cancer Paternal Uncle   . Irritable bowel syndrome Son   . Irritable bowel syndrome Daughter   . Diabetes Daughter     Social History   Socioeconomic History  . Marital status: Married    Spouse name: Not on file  . Number of children: 3  . Years of education: Not on file  . Highest education level: Not  on file  Occupational History    Employer: [employer]  Tobacco Use  . Smoking status: Never Smoker  . Smokeless tobacco: Never Used  Vaping Use  . Vaping Use: Never used  Substance and Sexual Activity  . Alcohol use: No    Alcohol/week: 0.0 standard drinks  . Drug use: No  . Sexual activity: Not on file  Other Topics Concern  . Not on file  Social History Narrative   About 2 caffeinated beverages daily   Social Determinants of Health   Financial Resource Strain: Not on file  Food Insecurity: Not on file  Transportation Needs: Not on file  Physical Activity: Not on file  Stress: Not on file  Social Connections: Not on file  Intimate Partner Violence: Not on file      Review of systems: All other review of systems negative except as mentioned in the HPI.   Physical Exam: Vitals:   07/22/20 0833  BP: 90/60  Pulse: 68   Body mass index is 27.63 kg/m. Gen:      No acute distress HEENT:  sclera anicteric Abd:      soft, non-tender; no palpable masses, no distension Ext:    No edema Neuro: alert and oriented x 3 Psych: normal mood and affect  Data Reviewed:  Reviewed labs, radiology imaging, old records and pertinent past GI work up   Assessment and Plan/Recommendations:  55 year old very pleasant female with complaints of intermittent right lower quadrant abdominal pain, mucus and blood per rectum Scheduled for colonoscopy to further evaluate, exclude neoplastic lesion or IBD  Chronic idiopathic constipation: Bowel purge as needed Continue Linzess Increase dietary fiber and water intake  Abdominal bloating: We will plan to do lactulose breath test after colonoscopy if continues to have persistent abdominal bloating to exclude small intestinal bacterial overgrowth  The risks and benefits as well as alternatives of endoscopic procedure(s) have been discussed and reviewed. All questions answered. The patient agrees to proceed.   This visit  required >40 minutes of patient care (this includes precharting, chart review, review of results, face-to-face time used for counseling as well as treatment plan and follow-up. The patient was provided an opportunity to ask questions and all were answered. The patient agreed with the plan and demonstrated an understanding of the instructions.  Damaris Hippo , MD    CC: Monico Blitz, MD

## 2020-07-29 ENCOUNTER — Other Ambulatory Visit: Payer: Self-pay

## 2020-07-29 ENCOUNTER — Ambulatory Visit (AMBULATORY_SURGERY_CENTER): Payer: BC Managed Care – PPO | Admitting: Gastroenterology

## 2020-07-29 ENCOUNTER — Encounter: Payer: Self-pay | Admitting: Gastroenterology

## 2020-07-29 VITALS — BP 122/50 | HR 65 | Temp 97.8°F | Resp 12 | Ht 63.0 in | Wt 156.0 lb

## 2020-07-29 DIAGNOSIS — D122 Benign neoplasm of ascending colon: Secondary | ICD-10-CM

## 2020-07-29 DIAGNOSIS — K922 Gastrointestinal hemorrhage, unspecified: Secondary | ICD-10-CM | POA: Diagnosis present

## 2020-07-29 DIAGNOSIS — K635 Polyp of colon: Secondary | ICD-10-CM

## 2020-07-29 DIAGNOSIS — D123 Benign neoplasm of transverse colon: Secondary | ICD-10-CM

## 2020-07-29 DIAGNOSIS — K5730 Diverticulosis of large intestine without perforation or abscess without bleeding: Secondary | ICD-10-CM

## 2020-07-29 DIAGNOSIS — K625 Hemorrhage of anus and rectum: Secondary | ICD-10-CM

## 2020-07-29 DIAGNOSIS — R1012 Left upper quadrant pain: Secondary | ICD-10-CM | POA: Diagnosis not present

## 2020-07-29 DIAGNOSIS — R1031 Right lower quadrant pain: Secondary | ICD-10-CM | POA: Diagnosis not present

## 2020-07-29 DIAGNOSIS — K648 Other hemorrhoids: Secondary | ICD-10-CM | POA: Diagnosis not present

## 2020-07-29 HISTORY — PX: COLONOSCOPY: SHX174

## 2020-07-29 MED ORDER — SODIUM CHLORIDE 0.9 % IV SOLN: 500.0000 mL | Freq: Once | INTRAVENOUS | Status: DC

## 2020-07-29 NOTE — Progress Notes (Signed)
Pt SB to 42 Dr. Silverio Decamp said no loop in place to reduce. Bradycardia treated with 0.2mg  of [email or web address].tb

## 2020-07-29 NOTE — Progress Notes (Signed)
Pt's states no medical or surgical changes since previsit or office visit.  Vitals PSF

## 2020-07-29 NOTE — Patient Instructions (Signed)
Await pathology- next colonoscopy based on pathology results  Please read over handouts about polyps, diverticulosis and hemorrhoids  Continue your normal medications   YOU HAD AN ENDOSCOPIC PROCEDURE TODAY AT [facility]:   Refer to the procedure report that was given to you for any specific questions about what was found during the examination.  If the procedure report does not answer your questions, please call your gastroenterologist to clarify.  If you requested that your care partner not be given the details of your procedure findings, then the procedure report has been included in a sealed envelope for you to review at your convenience later.  YOU SHOULD EXPECT: Some feelings of bloating in the abdomen. Passage of more gas than usual.  Walking can help get rid of the air that was put into your GI tract during the procedure and reduce the bloating. If you had a lower endoscopy (such as a colonoscopy or flexible sigmoidoscopy) you may notice spotting of blood in your stool or on the toilet paper. If you underwent a bowel prep for your procedure, you may not have a normal bowel movement for a few days.  Please Note:  You might notice some irritation and congestion in your nose or some drainage.  This is from the oxygen used during your procedure.  There is no need for concern and it should clear up in a day or so.  SYMPTOMS TO REPORT IMMEDIATELY:   Following lower endoscopy (colonoscopy or flexible sigmoidoscopy):  Excessive amounts of blood in the stool  Significant tenderness or worsening of abdominal pains  Swelling of the abdomen that is new, acute  Fever of 100F or higher  For urgent or emergent issues, a gastroenterologist can be reached at any hour by calling [phone]. Do not use MyChart messaging for urgent concerns.    DIET:  We do recommend a small meal at first, but then you may proceed to your regular diet.  Drink plenty of fluids but you should avoid  alcoholic beverages for 24 hours.  ACTIVITY:  You should plan to take it easy for the rest of today and you should NOT DRIVE or use heavy machinery until tomorrow (because of the sedation medicines used during the test).    FOLLOW UP: Our staff will call the number listed on your records 48-72 hours following your procedure to check on you and address any questions or concerns that you may have regarding the information given to you following your procedure. If we do not reach you, we will leave a message.  We will attempt to reach you two times.  During this call, we will ask if you have developed any symptoms of COVID 19. If you develop any symptoms (ie: fever, flu-like symptoms, shortness of breath, cough etc.) before then, please call [phone].  If you test positive for Covid 19 in the 2 weeks post procedure, please call and report this information to [place].    If any biopsies were taken you will be contacted by phone or by letter within the next 1-3 weeks.  Please call us at [phone] if you have not heard about the biopsies in 3 weeks.    SIGNATURES/CONFIDENTIALITY: You and/or your care partner have signed paperwork which will be entered into your electronic medical record.  These signatures attest to the fact that that the information above on your After Visit Summary has been reviewed and is understood.  Full responsibility of the confidentiality of this discharge information  lies with you and/or your care-partner.

## 2020-07-29 NOTE — Progress Notes (Signed)
Called to room to assist during endoscopic procedure.  Patient ID and intended procedure confirmed with present staff. Received instructions for my participation in the procedure from the performing physician.  

## 2020-07-29 NOTE — Op Note (Signed)
[facility] Patient Name: [name] Procedure Date: 07/29/2020 10:06 AM MRN: [id number] Endoscopist: Mauri Pole , MD Age: 55 Referring MD:  Date of Birth: [birth year] Gender: Female Account #: [id number] Procedure:                Colonoscopy Indications:              Evaluation of unexplained GI bleeding presenting                            with Hematochezia, Abdominal pain in the left lower                            quadrant, Abdominal pain in the right lower quadrant Medicines:                Monitored Anesthesia Care Procedure:                Pre-Anesthesia Assessment:                           - Prior to the procedure, a History and Physical                            was performed, and patient medications and                            allergies were reviewed. The patient's tolerance of                            previous anesthesia was also reviewed. The risks                            and benefits of the procedure and the sedation                            options and risks were discussed with the patient.                            All questions were answered, and informed consent                            was obtained. Prior Anticoagulants: The patient has                            taken no previous anticoagulant or antiplatelet                            agents. ASA Grade Assessment: II - A patient with                            mild systemic disease. After reviewing the risks                            and benefits, the patient was deemed in  satisfactory condition to undergo the procedure.                           After obtaining informed consent, the colonoscope                            was passed under direct vision. Throughout the                            procedure, the patient's blood pressure, pulse, and                            oxygen saturations were monitored continuously. The                             Olympus PFC-H190DL (#[id number]) Colonoscope was                            introduced through the anus and advanced to the the                            terminal ileum, with identification of the                            appendiceal orifice and IC valve. The colonoscopy                            was performed without difficulty. The patient                            tolerated the procedure well. The quality of the                            bowel preparation was good. The terminal ileum,                            ileocecal valve, appendiceal orifice, and rectum                            were photographed. Scope In: 10:20:12 AM Scope Out: 10:38:12 AM Scope Withdrawal Time: 0 hours 13 minutes 19 seconds  Total Procedure Duration: 0 hours 18 minutes 0 seconds  Findings:                 The perianal and digital rectal examinations were                            normal.                           A 4 mm polyp was found in the transverse colon. The                            polyp was sessile. The polyp was removed with a  cold snare. Resection and retrieval were complete.                           Two sessile polyps were found in the transverse                            colon and ascending colon. The polyps were 1 to 2                            mm in size. These polyps were removed with a cold                            biopsy forceps. Resection and retrieval were                            complete.                           Scattered small and large-mouthed diverticula were                            found in the sigmoid colon and descending colon.                           Internal hemorrhoids were found during                            retroflexion. The hemorrhoids were medium-sized. Complications:            No immediate complications. Estimated Blood Loss:     Estimated blood loss was minimal. Impression:               - One 4 mm polyp in the transverse  colon, removed                            with a cold snare. Resected and retrieved.                           - Two 1 to 2 mm polyps in the transverse colon and                            in the ascending colon, removed with a cold biopsy                            forceps. Resected and retrieved.                           - Diverticulosis in the sigmoid colon and in the                            descending colon.                           - Internal hemorrhoids. Recommendation:           -  Patient has a contact number available for                            emergencies. The signs and symptoms of potential                            delayed complications were discussed with the                            patient. Return to normal activities tomorrow.                            Written discharge instructions were provided to the                            patient.                           - Resume previous diet.                           - Continue present medications.                           - Await pathology results.                           - Repeat colonoscopy in 5-10 years for surveillance                            based on pathology results. Mauri Pole, MD 07/29/2020 10:47:47 AM This report has been signed electronically.

## 2020-07-31 ENCOUNTER — Telehealth: Payer: Self-pay

## 2020-07-31 ENCOUNTER — Encounter: Payer: Self-pay | Admitting: Gastroenterology

## 2020-07-31 ENCOUNTER — Telehealth: Payer: Self-pay | Admitting: *Deleted

## 2020-07-31 NOTE — Telephone Encounter (Signed)
  Follow up Call-  Call back number 07/29/2020  Post procedure Call Back phone  # [phone]  Permission to leave phone message Yes  Some recent data might be hidden     Patient questions:  Do you have a fever, pain , or abdominal swelling? No. Pain Score  0 *  Have you tolerated food without any problems? Yes.    Have you been able to return to your normal activities? Yes.    Do you have any questions about your discharge instructions: Diet   No. Medications  No. Follow up visit  No.  Do you have questions or concerns about your Care? No.  Actions: * If pain score is 4 or above: No action needed, pain <4.  1. Have you developed a fever since your procedure? no  2.   Have you had an respiratory symptoms (SOB or cough) since your procedure? no  3.   Have you tested positive for COVID 19 since your procedure no  4.   Have you had any family members/close contacts diagnosed with the COVID 19 since your procedure?  no   If yes to any of these questions please route to Joylene John, RN and Joella Prince, RN

## 2020-07-31 NOTE — Telephone Encounter (Signed)
Follow up call made. 

## 2020-08-12 ENCOUNTER — Telehealth: Payer: Self-pay | Admitting: Gastroenterology

## 2020-08-12 NOTE — Telephone Encounter (Signed)
Pt called and wanted to know the results for her colonoscopy. Please give pt a call. Thank you

## 2020-08-13 ENCOUNTER — Encounter: Payer: Self-pay | Admitting: Gastroenterology

## 2020-08-13 NOTE — Telephone Encounter (Signed)
Patient notified

## 2020-08-13 NOTE — Telephone Encounter (Signed)
Patient is going to do her breath test soon. Her symptoms are unimproved. She had COVID 4 days after her procedure. She is well now and back at work. I can call her with her results if you would like.

## 2020-08-13 NOTE — Telephone Encounter (Signed)
The polyps removed were benign, pre cancerous. I recommend recall colonoscopy in 5 years. She will receive a letter soon. Thanks

## 2020-09-09 ENCOUNTER — Telehealth: Payer: Self-pay | Admitting: Gastroenterology

## 2020-09-09 NOTE — Telephone Encounter (Signed)
Pt states that Linzess is not working and she has been having severe constipation. She would like something else to help her. Pls call her.

## 2020-09-10 ENCOUNTER — Other Ambulatory Visit: Payer: Self-pay

## 2020-09-10 ENCOUNTER — Telehealth: Payer: Self-pay

## 2020-09-10 MED ORDER — TRULANCE 3 MG PO TABS: 1.0000 | ORAL_TABLET | Freq: Every day | ORAL | 6 refills | Status: DC

## 2020-09-10 NOTE — Telephone Encounter (Signed)
Please send Rx for Trulance 3mg  daily. Increase dietary fiber and fluid intake.

## 2020-09-10 NOTE — Telephone Encounter (Signed)
Spoke with the patient. She feels it is time to try something other than Linzess. She is having more constipation and less relief with Linzess. She has increased the dosage on her own without improvement. She would like to try a new medication. Please advise.

## 2020-09-11 NOTE — Telephone Encounter (Signed)
Prior auth done for Trulance on 6/15, [facility] approved the rx until 09/10/2020

## 2020-10-17 ENCOUNTER — Other Ambulatory Visit: Payer: Self-pay | Admitting: Gastroenterology

## 2020-10-30 ENCOUNTER — Telehealth: Payer: Self-pay | Admitting: Gastroenterology

## 2020-10-30 NOTE — Telephone Encounter (Signed)
Inbound call from patient requesting refill Liz[facility]. Patient states she can not afford Trulance even with the coupon. States its over $300

## 2020-10-31 ENCOUNTER — Telehealth: Payer: Self-pay | Admitting: Gastroenterology

## 2020-10-31 MED ORDER — LINACLOTIDE 145 MCG PO CAPS: 145.0000 ug | ORAL_CAPSULE | Freq: Every day | ORAL | 2 refills | Status: DC

## 2020-10-31 NOTE — Telephone Encounter (Signed)
Due to cost of Trulance , patient would like to remain on Linzess 160mg once daily. [facility] [place] Pt has been informed.

## 2020-10-31 NOTE — Telephone Encounter (Signed)
See previous message regarding refill.

## 2021-01-06 ENCOUNTER — Telehealth: Payer: Self-pay | Admitting: Gastroenterology

## 2021-01-06 NOTE — Telephone Encounter (Signed)
Inbound call from pt stating that her Diverticulitis has flared up and needs a refill for Amoxicillin. Please advise. Thank you.

## 2021-01-07 MED ORDER — AMOXICILLIN-POT CLAVULANATE 875-125 MG PO TABS: 1.0000 | ORAL_TABLET | Freq: Two times a day (BID) | ORAL | 0 refills | Status: DC

## 2021-01-07 NOTE — Telephone Encounter (Signed)
Please send Rx for Augmentin 875mg  BID X 7 days and schedule office follow up visit next available appt. Advise patient to increase water intake and soft diet for 1 week. Call with any worsening symptoms.Thanks

## 2021-01-07 NOTE — Telephone Encounter (Signed)
Had to leave message for patient

## 2021-01-07 NOTE — Telephone Encounter (Signed)
Dr Silverio Decamp will you give her Amoxicillan or does she need to be seen

## 2021-01-23 DIAGNOSIS — N3010 Interstitial cystitis (chronic) without hematuria: Secondary | ICD-10-CM | POA: Insufficient documentation

## 2021-01-30 ENCOUNTER — Ambulatory Visit: Payer: BC Managed Care – PPO | Admitting: Gastroenterology

## 2021-03-25 ENCOUNTER — Other Ambulatory Visit: Payer: Self-pay | Admitting: Obstetrics and Gynecology

## 2021-03-25 ENCOUNTER — Telehealth: Payer: Self-pay | Admitting: Gastroenterology

## 2021-03-25 DIAGNOSIS — N6320 Unspecified lump in the left breast, unspecified quadrant: Secondary | ICD-10-CM

## 2021-03-25 NOTE — Telephone Encounter (Signed)
Spoke with the patient.  She tells me this is the ongoing pain in her lower abdomen. Dr Gaetano Net told her the pain is not from the ovary. "It is from the colon" and to "return to GI." No new symptoms. Linzess is working well for her. Non-urgent appointment scheduled with APP.

## 2021-03-25 NOTE — Telephone Encounter (Signed)
Patient called stating that she needed to be seen, asked if there was any appointments with Dr. Silverio Decamp tomorrow, I advised her on when the next available date was to be seen. Patient wanted me to send message to nurse, seeking advice on if there is anyway she could be worked in sooner. Patient is having Diverticulitis flare up. Please advise.

## 2021-04-08 ENCOUNTER — Other Ambulatory Visit: Payer: Self-pay

## 2021-04-12 NOTE — Progress Notes (Signed)
04/12/2021 [name] [id number] 12-15-1965   Chief Complaint: RLQ pain constipation   History of Present Illness: [name] is a 56 year old female with a past medical history of GERD, constipation, diverticulitis and colon polyps. S/p hysterectomy and right oophorectomy.  She was last seen in office by Dr. Silverio Decamp 07/22/2020 due to having abdominal bloat, RLQ pain, intermittent rectal bleeding and mucous per the rectum.  She underwent a colonoscopy 08/18/2020 which identified 3 polyps which were removed from the ascending and transverse colon, biopsies showed 2 sessile serrated polyps and one polyp was benign colonic mucosa with lymphoid aggregate.  She was advised to repeat a colonoscopy in 5 years.  She presents today for further evaluation regarding chronic constipation and episodic RLQ pain.  She is passing a small hard stool most days.  She takes Linzess 145 mcg 1 tab in the afternoon during the weekdays and 1 tab in the morning on the weekends.  She infrequently sees a small amount of bright red blood on the stool and toilet tissue when constipated and strains to pass a bowel movement.  She sometimes feels as if stool gets stuck in the rectum which is difficult to pass. Her last episode of RLQ pain 12/2020 and she was prescribed a course of Augmentin 8 7 5  mg twice daily for 7 days for presumed diverticulitis.  She stated her abdominal pain improved after she took Augmentin.  She under went evaluation by her gynecologist and reported undergoing 2 pelvic sonograms which showed a normal right ovary which was likely the source of her RLQ pain.  She was advised to follow-up in her office for further GI evaluation.  Her most recent CTAP was 10/24/2018 which was negative for diverticulitis.  She was previously prescribed SIBO breath test to assess for small bacteria or overgrowth but she has not yet completed the study.  She is also undergoing evaluation for left breast  lump.  Colonoscopy 08/18/2020 by Dr. Silverio Decamp: - One 4 mm polyp in the transverse colon, removed with a cold snare. Resected and retrieved. - Two 1 to 2 mm polyps in the transverse colon and in the ascending colon, removed with a cold biopsy forceps. Resected and retrieved. - Diverticulosis in the sigmoid colon and in the descending colon. - Internal hemorrhoids. - 5 year colonoscopy recall Path report: SESSILE SERRATED POLYP WITHOUT CYTOLOGIC DYSPLASIA (X2). - COLONIC MUCOSA WITH UNDERLYING LYMPHOID AGGREGATE (X1)   Colonoscopy February 2017 by Dr. Ardis Hughs showed diverticulosis in the left colon otherwise unremarkable exam  EGD February 2017: Unremarkable  CBC Latest Ref Rng & Units 03/28/2019 10/23/2018 08/11/2017  WBC 4.0 - 10.5 K/uL 5.5 5.1 7.0  Hemoglobin 12.0 - 15.0 g/dL 13.6 13.2 13.6  Hematocrit 36.0 - 46.0 % 39.8 39.1 39.3  Platelets 150.0 - 400.0 K/uL 240.0 192.0 178.0    CMP Latest Ref Rng & Units 03/28/2019 10/23/2018 08/11/2017  Glucose 70 - 99 mg/dL 94 85 90  BUN 6 - 23 mg/dL 15 18 15   Creatinine 0.40 - 1.20 mg/dL 0.87 0.82 0.87  Sodium 135 - 145 mEq/L 139 141 140  Potassium 3.5 - 5.1 mEq/L 3.7 4.3 3.8  Chloride 96 - 112 mEq/L 103 106 104  CO2 19 - 32 mEq/L 29 29 29   Calcium 8.4 - 10.5 mg/dL 9.4 9.7 9.5  Total Protein 6.0 - 8.3 g/dL 6.7 6.8 6.9  Total Bilirubin 0.2 - 1.2 mg/dL 0.3 0.3 0.4  Alkaline Phos 39 - 117 U/L  77 69 62  AST 0 - 37 U/L 22 24 15   ALT 0 - 35 U/L 18 20 12     CTAP 10/24/2018: EXAM: CT ABDOMEN AND PELVIS WITH CONTRAST   TECHNIQUE: Multidetector CT imaging of the abdomen and pelvis was performed using the standard protocol following bolus administration of intravenous contrast.   CONTRAST:  165mL OMNIPAQUE IOHEXOL 300 MG/ML  SOLN   COMPARISON:  08/12/2017   FINDINGS: Lower chest: Normal   Hepatobiliary: Normal   Pancreas: Normal   Spleen: Normal   Adrenals/Urinary Tract: Adrenal glands are normal. Kidneys are normal. Bladder is  normal.   Stomach/Bowel: Sigmoid diverticulosis without evidence of diverticulitis presently. No evidence of acute bowel pathology. No sign of appendicitis.   Vascular/Lymphatic: Normal   Reproductive: Previous hysterectomy.  No pelvic mass.   Other: No free fluid or air.   Musculoskeletal: Previous lower lumbar discectomy and fusion with degenerative change above and below that.   IMPRESSION: No acute finding by CT. The patient does have sigmoid diverticulosis but there is no evidence of acute diverticulitis. No other acute or significant bowel finding. No solid organ pathology.  Past Medical History:  Diagnosis Date   Abdominal pain    Chest pain 10.7.2010   stress echo...abnormal   Colon polyp    Constipation    Depression    Diverticulitis    Diverticulosis    GERD (gastroesophageal reflux disease)    Hemorrhoid    History of panic attacks    HOH (hard of hearing)    left ear   Hyperventilation    IBS (irritable bowel syndrome)    Interstitial cystitis    Maxillary sinusitis    Migraines    Seasonal allergies    Urinary tract bacterial infections    Past Surgical History:  Procedure Laterality Date   COLONOSCOPY  07/29/2020   states has had 2018 for diverticulitis   LAPAROSCOPY     abd   SALPINGECTOMY     SPINAL FUSION     TUBAL LIGATION     VAGINAL HYSTERECTOMY     Current Outpatient Medications on File Prior to Visit  Medication Sig Dispense Refill   AMBULATORY NON FORMULARY MEDICATION Take 1 capsule by mouth every morning. Medication Name: IBGard     amoxicillin (AMOXIL) 500 MG tablet Take 500 mg by mouth 3 (three) times daily.     aspirin-acetaminophen-caffeine (EXCEDRIN MIGRAINE) 250-250-65 MG per tablet Take 1 tablet by mouth as needed.     dicyclomine (BENTYL) 20 MG tablet Take 1 tablet (20 mg total) by mouth every 8 (eight) hours as needed for spasms. 60 tablet 2   hydrochlorothiazide (HYDRODIURIL) 12.5 MG tablet Take 12.5 mg by mouth daily.      Multiple Vitamin (MULTIVITAMIN) tablet Take 1 tablet by mouth daily.     omeprazole (PRILOSEC) 40 MG capsule Take 40 mg by mouth daily.     rizatriptan (MAXALT) 10 MG tablet Take 10 mg by mouth as needed. May repeat in 2 hours if needed     No current facility-administered medications on file prior to visit.   Allergies  Allergen Reactions   Morphine And Related     Rash,SOB,whelts   Ciprofloxacin Itching   Codeine     rash   Depo-Medrol [Methylprednisolone Sodium Succ]     Rash, anxiety   Flagyl [Metronidazole] Itching   Current Medications, Allergies, Past Medical History, Past Surgical History, Family History and Social History were reviewed in [employer] record.  Review of Systems:   Constitutional: Negative for fever, sweats, chills or weight loss.  Respiratory: Negative for shortness of breath.   Cardiovascular: Negative for chest pain, palpitations and leg swelling.  Gastrointestinal: See HPI.  Musculoskeletal: Negative for back pain or muscle aches.  Neurological: Negative for dizziness, headaches or paresthesias.   Physical Exam: BP 100/60    Pulse 60    Ht 5\' 3"  (1.6 m)    Wt 146 lb 3.2 oz (66.3 kg)    BMI 25.90 kg/m   General: 56 year old female in no acute distress. Head: Normocephalic and atraumatic. Eyes: No scleral icterus. Conjunctiva pink . Ears: Normal auditory acuity. Mouth: Dentition intact. No ulcers or lesions.  Lungs: Clear throughout to auscultation. Heart: Regular rate and rhythm, no murmur. Abdomen: Soft, nondistended.  Mild RLQ tenderness without rebound or guarding.  No masses or hepatomegaly. Normal bowel sounds x 4 quadrants.  Rectal: Deferred. Musculoskeletal: Symmetrical with no gross deformities. Extremities: No edema. Neurological: Alert oriented x 4. No focal deficits.  Psychological: Alert and cooperative. Normal mood and affect  Assessment and Recommendations:  66) 56 year old female with chronic  constipation and episodic RLQ pain, last episode occurred 12/2020 which significantly improved after course of Augmentin. CTAP 09/2018 showed evidence of diverticulosis without diverticulitis.  Colonoscopy 08/18/2020 showed sigmoid diverticulosis, no evidence of colitis and 3 polyps removed from the colon.  Patient reported pelvic ultrasound showed a stable right ovary. She remains on Linzess 145 mcg daily which results in passing small hard stools.  -Increase Linzess 290 mcg 1 p.o. daily -Elevate feet on stool when sitting on the commode to facilitate passage of BM -Patient declined rectal/pelvic floor physical therapy -Patient to call office at time of next attack of RLQ pain, to consider abdominal/pelvic CT angiogram at that time -Complete SIBO breath test to rule out small intestinal bacterial overgrowth as previously ordered by Dr. Silverio Decamp -Continue align probiotic for now, switch probiotics every few months  2) History of 2 sessile serrated polyps and one lymphoid aggregate polyp per colonoscopy 08/18/2020 -Next colonoscopy due 07/2025

## 2021-04-13 ENCOUNTER — Ambulatory Visit: Payer: BC Managed Care – PPO | Admitting: Nurse Practitioner

## 2021-04-13 ENCOUNTER — Encounter: Payer: Self-pay | Admitting: Nurse Practitioner

## 2021-04-13 VITALS — BP 100/60 | HR 60 | Ht 63.0 in | Wt 146.2 lb

## 2021-04-13 DIAGNOSIS — R1031 Right lower quadrant pain: Secondary | ICD-10-CM

## 2021-04-13 DIAGNOSIS — K5909 Other constipation: Secondary | ICD-10-CM

## 2021-04-13 MED ORDER — LINACLOTIDE 290 MCG PO CAPS: 290.0000 ug | ORAL_CAPSULE | Freq: Every day | ORAL | 1 refills | Status: DC

## 2021-04-13 MED ORDER — LINACLOTIDE 290 MCG PO CAPS: 290.0000 ug | ORAL_CAPSULE | Freq: Every day | ORAL | 0 refills | Status: DC

## 2021-04-13 NOTE — Patient Instructions (Addendum)
If you are age 57 or younger, your body mass index should be between 19-25. Your Body mass index is 25.9 kg/m. If this is out of the aformentioned range listed, please consider follow up with your Primary Care Provider.   The [facility] providers would like to encourage you to use [facility] to communicate with providers for non-urgent requests or questions.  Due to long hold times on the telephone, sending your provider a message by [facility] may be faster and more efficient way to get a response. Please allow 48 business hours for a response.  Please remember that this is for non-urgent requests/questions.  We have sent in the increased dose on Linzess to your pharmacy.  Complete the SIBO test you have at home.  Contact our office the next time you have an attack of RLQ abdominal pain.  It was great seeing you today! Thank you for entrusting me with your care and choosing [facility].  Noralyn Pick, CRNP

## 2021-04-21 NOTE — Progress Notes (Signed)
Reviewed and agree with documentation and assessment and plan. K. Veena Deepa Barthel , MD   

## 2021-04-27 ENCOUNTER — Other Ambulatory Visit: Payer: Self-pay

## 2021-04-27 ENCOUNTER — Ambulatory Visit
Admission: RE | Admit: 2021-04-27 | Discharge: 2021-04-27 | Disposition: A | Discharge status: Home / Self Care | Payer: BC Managed Care – PPO | Source: Ambulatory Visit | Attending: Obstetrics and Gynecology | Admitting: Obstetrics and Gynecology

## 2021-04-27 DIAGNOSIS — N6320 Unspecified lump in the left breast, unspecified quadrant: Secondary | ICD-10-CM

## 2021-06-05 ENCOUNTER — Other Ambulatory Visit: Payer: Self-pay | Admitting: Surgery

## 2021-06-23 ENCOUNTER — Other Ambulatory Visit: Payer: Self-pay

## 2021-06-23 ENCOUNTER — Other Ambulatory Visit: Payer: Self-pay | Admitting: Nurse Practitioner

## 2021-06-23 ENCOUNTER — Encounter (HOSPITAL_BASED_OUTPATIENT_CLINIC_OR_DEPARTMENT_OTHER): Payer: Self-pay | Admitting: Surgery

## 2021-07-01 NOTE — H&P (Signed)
?REFERRING PHYSICIAN: Shon Millet, MD ? ?PROVIDER: Beverlee Nims, MD ? ?MRN: [id number] ?DOB: [birth year] ?DATE OF ENCOUNTER: 06/05/2021 ?Subjective  ? ?Chief Complaint: New Consultation (Left Breast) ? ? ?History of Present Illness: ?[name] is a 56 y.o. female who is seen today as an office consultation for evaluation of New Consultation (Left Breast) ?.  ? ?This is a pleasant 56 year old female referred here for evaluation of a left breast mass. She originally felt a mass in the shower approximately 5 months ago. She said it was very small and describes that about the size of a pea. She saw Dr. Gaetano Net who can palpate the mass as well. She then underwent diagnostic mammograms and ultrasound in January which were unremarkable with no abnormalities identified. She reports that since that time the mass has gotten larger. She has itching in the breast but no pain. She denies trauma to the area. She denies nipple discharge. ?She has a maternal aunt who was recently diagnosed with breast cancer. She has had no previous problems regarding her breast. ? ?Review of Systems: ?A complete review of systems was obtained from the patient. I have reviewed this information and discussed as appropriate with the patient. See HPI as well for other ROS. ? ?ROS  ? ?Medical History: ?Past Medical History:  ?Diagnosis Date  ? Anxiety  ? Arthritis  ? GERD (gastroesophageal reflux disease)  ? Hyperlipidemia  ? ?Patient Active Problem List  ?Diagnosis  ? Anxiety  ? Arthritis  ? Chest pain  ? Bloating  ? Bowel habit changes  ? Chronic interstitial cystitis  ? Diverticulitis  ? Diverticulosis of colon without hemorrhage  ? Esophageal reflux  ? Intraperitoneal adhesions  ? Irritable bowel syndrome  ? Lower gastrointestinal bleed  ? Migraine headache  ? Other malaise and fatigue  ? Gas pain  ? ?Past Surgical History:  ?Procedure Laterality Date  ? Oophorectomy Left  ? Spinal Fusion Surgery  ?L4-5  ? ? ?Allergies  ?Allergen  Reactions  ? Morphine Rash and Shortness Of Breath  ? Ciprofloxacin Itching  ? Codeine Itching and Rash  ?rash ? ? Methylprednisolone Acetate Itching and Rash  ? Methylprednisolone Sodium Succ Unknown  ?Rash, anxiety  ? Metronidazole Itching  ? ?Current Outpatient Medications on File Prior to Visit  ?Medication Sig Dispense Refill  ? ascorbic acid, vitamin C, (VITAMIN C) 100 MG tablet Take by mouth once daily  ? atorvastatin (LIPITOR) 20 MG tablet Take 20 mg by mouth at bedtime  ? Bifidobacterium infantis (ALIGN ORAL) Take by mouth  ? biotin 1 mg Cap Take by mouth  ? cholecalciferol, vitamin D3, 10 mcg (400 unit) Cap Vitamin D3  ? LINZESS 290 mcg capsule TAKE 1 CAPSULE BY MOUTH DAILY BEFORE BREAKFAST.  ? multivitamin tablet Take 1 tablet by mouth once daily  ? omeprazole (PRILOSEC) 40 MG DR capsule omeprazole 40 mg capsule,delayed release ?TAKE 1 CAPSULE BY MOUTH EVERY DAY  ? ?No current facility-administered medications on file prior to visit.  ? ?History reviewed. No pertinent family history.  ? ?Social History  ? ?Tobacco Use  ?Smoking Status Never  ?Smokeless Tobacco Never  ? ? ?Social History  ? ?Socioeconomic History  ? Marital status: Married  ?Tobacco Use  ? Smoking status: Never  ? Smokeless tobacco: Never  ?Vaping Use  ? Vaping Use: Unknown  ?Substance and Sexual Activity  ? Alcohol use: Not Currently  ? Drug use: Never  ? ?Objective:  ? ?Vitals:  ?06/05/21 1117  ?BP:  112/60  ?Pulse: 68  ?Temp: 37.1 ?C (98.7 ?F)  ?SpO2: 97%  ?Weight: 66.1 kg (145 lb 12.8 oz)  ?Height: 160 cm ('5\' 3"'$ )  ? ?Body mass index is 25.83 kg/m?. ? ?Physical Exam  ? ?She appears well on exam. ? ?In the upper outer quadrant of her left breast, the tissue feels fibrocystic and lumpy but there is a discrete mass measuring approximately 1 cm. The nipple areolar complex is normal. ? ?There is no axillary adenopathy. ? ?Labs, Imaging and Diagnostic Testing: ?Reviewed her mammograms and ultrasounds from January 2023 ? ?Assessment and Plan:   ? ?Diagnoses and all orders for this visit: ? ?Mass of upper outer quadrant of left breast ? ? ? ?Given her palpable concern in the left breast and her family history of breast cancer, a lumpectomy is recommended to remove this area for complete histologic evaluation to rule out a malignancy. We discussed continued conservative management with repeat films in a few months, but given her family history and breast density, we agreed to proceed with surgery instead. ?We discussed the surgical procedure in detail. We discussed the risk which includes but is not limited to bleeding, infection, injury to surrounding structures, the need for further procedures if malignancy is found, postoperative recovery, etc. Surgery will be scheduled  ?

## 2021-07-02 ENCOUNTER — Other Ambulatory Visit: Payer: Self-pay

## 2021-07-02 ENCOUNTER — Ambulatory Visit (HOSPITAL_BASED_OUTPATIENT_CLINIC_OR_DEPARTMENT_OTHER): Payer: BC Managed Care – PPO | Admitting: Anesthesiology

## 2021-07-02 ENCOUNTER — Encounter (HOSPITAL_BASED_OUTPATIENT_CLINIC_OR_DEPARTMENT_OTHER): Payer: Self-pay | Admitting: Surgery

## 2021-07-02 ENCOUNTER — Ambulatory Visit (HOSPITAL_BASED_OUTPATIENT_CLINIC_OR_DEPARTMENT_OTHER)
Admission: RE | Admit: 2021-07-02 | Discharge: 2021-07-02 | Disposition: A | Discharge status: Home / Self Care | Payer: BC Managed Care – PPO | Attending: Surgery | Admitting: Surgery

## 2021-07-02 ENCOUNTER — Encounter (HOSPITAL_BASED_OUTPATIENT_CLINIC_OR_DEPARTMENT_OTHER)
Admission: RE | Disposition: A | Discharge status: Home / Self Care | Payer: Self-pay | Source: Home / Self Care | Attending: Surgery

## 2021-07-02 DIAGNOSIS — Z79899 Other long term (current) drug therapy: Secondary | ICD-10-CM | POA: Insufficient documentation

## 2021-07-02 DIAGNOSIS — N6321 Unspecified lump in the left breast, upper outer quadrant: Secondary | ICD-10-CM | POA: Diagnosis not present

## 2021-07-02 DIAGNOSIS — Z803 Family history of malignant neoplasm of breast: Secondary | ICD-10-CM | POA: Diagnosis not present

## 2021-07-02 DIAGNOSIS — K219 Gastro-esophageal reflux disease without esophagitis: Secondary | ICD-10-CM | POA: Diagnosis not present

## 2021-07-02 HISTORY — DX: Unspecified lump in the left breast, unspecified quadrant: N63.20

## 2021-07-02 HISTORY — PX: BREAST LUMPECTOMY: SHX2

## 2021-07-02 SURGERY — BREAST LUMPECTOMY
Anesthesia: General | Site: Breast | Laterality: Left

## 2021-07-02 MED ORDER — DEXMEDETOMIDINE (PRECEDEX) IN NS 20 MCG/5ML (4 MCG/ML) IV SYRINGE
PREFILLED_SYRINGE | INTRAVENOUS | Status: DC | PRN
  Administered 2021-07-02: 4 ug via INTRAVENOUS
  Administered 2021-07-02 (×2): 8 ug via INTRAVENOUS

## 2021-07-02 MED ORDER — AMISULPRIDE (ANTIEMETIC) 5 MG/2ML IV SOLN: 10.0000 mg | Freq: Once | INTRAVENOUS | Status: DC | PRN

## 2021-07-02 MED ORDER — ENSURE PRE-SURGERY PO LIQD: 296.0000 mL | Freq: Once | ORAL | Status: DC

## 2021-07-02 MED ORDER — CHLORHEXIDINE GLUCONATE CLOTH 2 % EX PADS: 6.0000 | MEDICATED_PAD | Freq: Once | CUTANEOUS | Status: DC

## 2021-07-02 MED ORDER — PROPOFOL 500 MG/50ML IV EMUL
INTRAVENOUS | Status: AC
  Filled 2021-07-02: qty 50

## 2021-07-02 MED ORDER — ACETAMINOPHEN 500 MG PO TABS
1000.0000 mg | ORAL_TABLET | ORAL | Status: AC
  Administered 2021-07-02: 1000 mg via ORAL

## 2021-07-02 MED ORDER — LACTATED RINGERS IV SOLN: INTRAVENOUS | Status: DC

## 2021-07-02 MED ORDER — ONDANSETRON HCL 4 MG/2ML IJ SOLN: 4.0000 mg | Freq: Once | INTRAMUSCULAR | Status: DC | PRN

## 2021-07-02 MED ORDER — FENTANYL CITRATE (PF) 100 MCG/2ML IJ SOLN
INTRAMUSCULAR | Status: AC
  Filled 2021-07-02: qty 2

## 2021-07-02 MED ORDER — MIDAZOLAM HCL 5 MG/5ML IJ SOLN
INTRAMUSCULAR | Status: DC | PRN
Start: 2021-07-02 — End: 2021-07-02
  Administered 2021-07-02: 2 mg via INTRAVENOUS

## 2021-07-02 MED ORDER — ONDANSETRON HCL 4 MG/2ML IJ SOLN
INTRAMUSCULAR | Status: AC
  Filled 2021-07-02: qty 2

## 2021-07-02 MED ORDER — KETOROLAC TROMETHAMINE 30 MG/ML IJ SOLN: 30.0000 mg | Freq: Once | INTRAMUSCULAR | Status: DC | PRN

## 2021-07-02 MED ORDER — ONDANSETRON HCL 4 MG/2ML IJ SOLN
INTRAMUSCULAR | Status: DC | PRN
  Administered 2021-07-02: 4 mg via INTRAVENOUS

## 2021-07-02 MED ORDER — EPHEDRINE SULFATE-NACL 50-0.9 MG/10ML-% IV SOSY
PREFILLED_SYRINGE | INTRAVENOUS | Status: DC | PRN
  Administered 2021-07-02: 10 mg via INTRAVENOUS
  Administered 2021-07-02: 5 mg via INTRAVENOUS

## 2021-07-02 MED ORDER — MIDAZOLAM HCL 2 MG/2ML IJ SOLN
INTRAMUSCULAR | Status: AC
  Filled 2021-07-02: qty 2

## 2021-07-02 MED ORDER — OXYCODONE HCL 5 MG PO TABS: 5.0000 mg | ORAL_TABLET | Freq: Once | ORAL | Status: DC | PRN

## 2021-07-02 MED ORDER — PROPOFOL 10 MG/ML IV BOLUS
INTRAVENOUS | Status: DC | PRN
  Administered 2021-07-02: 150 mg via INTRAVENOUS

## 2021-07-02 MED ORDER — TRAMADOL HCL 50 MG PO TABS: 50.0000 mg | ORAL_TABLET | Freq: Four times a day (QID) | ORAL | 0 refills | Status: DC | PRN

## 2021-07-02 MED ORDER — HYDROMORPHONE HCL 1 MG/ML IJ SOLN: 0.2500 mg | INTRAMUSCULAR | Status: DC | PRN

## 2021-07-02 MED ORDER — OXYCODONE HCL 5 MG/5ML PO SOLN: 5.0000 mg | Freq: Once | ORAL | Status: DC | PRN

## 2021-07-02 MED ORDER — CEFAZOLIN SODIUM-DEXTROSE 2-4 GM/100ML-% IV SOLN
INTRAVENOUS | Status: AC
  Filled 2021-07-02: qty 100

## 2021-07-02 MED ORDER — LIDOCAINE 2% (20 MG/ML) 5 ML SYRINGE
INTRAMUSCULAR | Status: DC | PRN
  Administered 2021-07-02: 60 mg via INTRAVENOUS

## 2021-07-02 MED ORDER — ACETAMINOPHEN 500 MG PO TABS
ORAL_TABLET | ORAL | Status: AC
  Filled 2021-07-02: qty 2

## 2021-07-02 MED ORDER — CEFAZOLIN SODIUM-DEXTROSE 2-4 GM/100ML-% IV SOLN
2.0000 g | INTRAVENOUS | Status: AC
  Administered 2021-07-02: 2 g via INTRAVENOUS

## 2021-07-02 MED ORDER — BUPIVACAINE-EPINEPHRINE 0.5% -1:200000 IJ SOLN
INTRAMUSCULAR | Status: DC | PRN
Start: 2021-07-02 — End: 2021-07-02
  Administered 2021-07-02: 8 mL

## 2021-07-02 MED ORDER — FENTANYL CITRATE (PF) 100 MCG/2ML IJ SOLN
INTRAMUSCULAR | Status: DC | PRN
  Administered 2021-07-02: 50 ug via INTRAVENOUS
  Administered 2021-07-02 (×2): 25 ug via INTRAVENOUS

## 2021-07-02 SURGICAL SUPPLY — 34 items
ADH SKN CLS APL DERMABOND .7 (GAUZE/BANDAGES/DRESSINGS) ×1
APL PRP STRL LF DISP 70% ISPRP (MISCELLANEOUS) ×1
BLADE SURG 15 STRL LF DISP TIS (BLADE) ×2 IMPLANT
BLADE SURG 15 STRL SS (BLADE) ×2
CHLORAPREP W/TINT 26 (MISCELLANEOUS) ×3 IMPLANT
COVER BACK TABLE 60X90IN (DRAPES) ×3 IMPLANT
COVER MAYO STAND STRL (DRAPES) ×3 IMPLANT
DERMABOND ADVANCED (GAUZE/BANDAGES/DRESSINGS) ×1
DERMABOND ADVANCED .7 DNX12 (GAUZE/BANDAGES/DRESSINGS) ×2 IMPLANT
DRAPE LAPAROTOMY 100X72 PEDS (DRAPES) ×3 IMPLANT
DRAPE UTILITY XL STRL (DRAPES) ×3 IMPLANT
ELECT REM PT RETURN 9FT ADLT (ELECTROSURGICAL) ×2
ELECTRODE REM PT RTRN 9FT ADLT (ELECTROSURGICAL) ×2 IMPLANT
GAUZE SPONGE 4X4 12PLY STRL LF (GAUZE/BANDAGES/DRESSINGS) ×3 IMPLANT
GLOVE SURG POLYISO LF SZ7 (GLOVE) ×2 IMPLANT
GLOVE SURG SIGNA 7.5 PF LTX (GLOVE) ×3 IMPLANT
GLOVE SURG UNDER POLY LF SZ7 (GLOVE) ×2 IMPLANT
GOWN STRL REUS W/ TWL LRG LVL3 (GOWN DISPOSABLE) ×2 IMPLANT
GOWN STRL REUS W/ TWL XL LVL3 (GOWN DISPOSABLE) ×2 IMPLANT
GOWN STRL REUS W/TWL LRG LVL3 (GOWN DISPOSABLE) ×2
GOWN STRL REUS W/TWL XL LVL3 (GOWN DISPOSABLE) ×4
KIT MARKER MARGIN INK (KITS) ×2 IMPLANT
NDL HYPO 25X1 1.5 SAFETY (NEEDLE) ×2 IMPLANT
NEEDLE HYPO 25X1 1.5 SAFETY (NEEDLE) ×2 IMPLANT
NS IRRIG 1000ML POUR BTL (IV SOLUTION) ×3 IMPLANT
PACK BASIN DAY SURGERY FS (CUSTOM PROCEDURE TRAY) ×3 IMPLANT
PENCIL SMOKE EVACUATOR (MISCELLANEOUS) ×3 IMPLANT
SLEEVE SCD COMPRESS KNEE MED (STOCKING) ×1 IMPLANT
SPONGE T-LAP 4X18 ~~LOC~~+RFID (SPONGE) ×3 IMPLANT
SUT MNCRL AB 4-0 PS2 18 (SUTURE) ×3 IMPLANT
SUT VIC AB 3-0 SH 27 (SUTURE) ×2
SUT VIC AB 3-0 SH 27X BRD (SUTURE) ×2 IMPLANT
SYR CONTROL 10ML LL (SYRINGE) ×3 IMPLANT
TOWEL GREEN STERILE FF (TOWEL DISPOSABLE) ×3 IMPLANT

## 2021-07-02 NOTE — Op Note (Signed)
LEFT BREAST LUMPECTOMY  Procedure Note ? ?[place] ?07/02/2021 ? ? ?Pre-op Diagnosis: LEFT BREAST MASS ?    ?Post-op Diagnosis: same ? ?Procedure(s): ?LEFT BREAST LUMPECTOMY ? ?Surgeon(s): ?Coralie Keens, MD ? ?Anesthesia: General ? ?Staff:  ?Circulator: Izora Ribas, RN ?Scrub Person: Romero Liner, CST ? ?Estimated Blood Loss: Minimal ?              ?Specimens: sent to path, left breast mass ? ?Indications: This is a 56 year old female who was seeing her gynecologist who felt a potential mass in the upper outer quadrant of left breast.  Mammogram and ultrasound were unremarkable.  There was a slightly palpable mass in the location of concern.  The decision was made to proceed with a lumpectomy ? ?Procedure: The patient was brought to operating identifies correct patient.  She is placed upon the operating table and general anesthesia was induced.  I had marked the area of concern that the patient could palpate and I could palpate in the preoperative holding area.  Her left breast was prepped and draped in usual sterile fashion.  I anesthetized the skin overlying the palpable mass in the upper outer quadrant left breast with Marcaine.  I then made an incision with a scalpel.  I dissected down to the breast tissue with the cautery.  The palpable mass appeared consistent with fatty breast tissue.  I excised it in its entirety.  It measured about 1.5 cm.  I extensively palpated in the area in all directions and found no other palpable masses.  This tissue was overlying a rib.  The mass was sent to pathology for evaluation.  I injected further Marcaine to the incision.  Hemostasis appeared to be achieved.  I then closed the subcutaneous tissue with interrupted 3-0 Vicryl sutures and closed the skin with a running 4-0 Monocryl.  Dermabond was then applied.  The patient tolerated the procedure well.  All the counts were correct at the end of the procedure.  The patient was then extubated in the  operating room and taken in stable condition to the recovery room. ?        ? ?Coralie Keens  ? ?Date: 07/02/2021  Time: 7:54 AM ? ? ? ?

## 2021-07-02 NOTE — Discharge Instructions (Addendum)
[facility] ?Office Phone Number [phone] ? ?BREAST BIOPSY/ PARTIAL MASTECTOMY: POST OP INSTRUCTIONS ? ?Always review your discharge instruction sheet given to you by the facility where your surgery was performed. ? ?IF YOU HAVE DISABILITY OR FAMILY LEAVE FORMS, YOU MUST BRING THEM TO THE OFFICE FOR PROCESSING.  DO NOT GIVE THEM TO YOUR DOCTOR. ? ?A prescription for pain medication may be given to you upon discharge.  Take your pain medication as prescribed, if needed.  If narcotic pain medicine is not needed, then you may take acetaminophen (Tylenol) or ibuprofen (Advil) as needed. ?Take your usually prescribed medications unless otherwise directed ?If you need a refill on your pain medication, please contact your pharmacy.  They will contact our office to request authorization.  Prescriptions will not be filled after 5pm or on week-ends. ?You should eat very light the first 24 hours after surgery, such as soup, crackers, pudding, etc.  Resume your normal diet the day after surgery. ?Most patients will experience some swelling and bruising in the breast.  Ice packs and a good support bra will help.  Swelling and bruising can take several days to resolve.  ?It is common to experience some constipation if taking pain medication after surgery.  Increasing fluid intake and taking a stool softener will usually help or prevent this problem from occurring.  A mild laxative (Milk of Magnesia or Miralax) should be taken according to package directions if there are no bowel movements after 48 hours. ?Unless discharge instructions indicate otherwise, you may remove your bandages 24-48 hours after surgery, and you may shower at that time.  You may have steri-strips (small skin tapes) in place directly over the incision.  These strips should be left on the skin for 7-10 days.  If your surgeon used skin glue on the incision, you may shower in 24 hours.  The glue will flake off over the next 2-3 weeks.  Any  sutures or staples will be removed at the office during your follow-up visit. ?ACTIVITIES:  You may resume regular daily activities (gradually increasing) beginning the next day.  Wearing a good support bra or sports bra minimizes pain and swelling.  You may have sexual intercourse when it is comfortable. ?You may drive when you no longer are taking prescription pain medication, you can comfortably wear a seatbelt, and you can safely maneuver your car and apply brakes. ?RETURN TO WORK:  ______________________________________________________________________________________ ?You should see your doctor in the office for a follow-up appointment approximately two weeks after your surgery.  Your doctor?s nurse will typically make your follow-up appointment when she calls you with your pathology report.  Expect your pathology report 2-3 business days after your surgery.  You may call to check if you do not hear from [place] after three days. ?OTHER INSTRUCTIONS: OK TO SHOWER STARTING TOMORROW ?ICE PACK, TYLENOL, AND IBUPROFEN ALSO FOR PAIN ?NO VIGOROUS ACTIVITY FOR ONE WEEK ?_______________________________________________________________________________________________ _____________________________________________________________________________________________________________________________________ ?_____________________________________________________________________________________________________________________________________ ?_____________________________________________________________________________________________________________________________________ ? ?WHEN TO CALL YOUR DOCTOR: ?Fever over 101.0 ?Nausea and/or vomiting. ?Extreme swelling or bruising. ?Continued bleeding from incision. ?Increased pain, redness, or drainage from the incision. ? ?The clinic staff is available to answer your questions during regular business hours.  Please don?t hesitate to call and ask to speak to one of the nurses for clinical  concerns.  If you have a medical emergency, go to the nearest emergency room or call 911.  A surgeon from [facility] is always on call at the hospital. ? ?For further questions, please visit  [email or web address]   ? ? ? ?Post Anesthesia Home Care Instructions ? ?Activity: ?Get plenty of rest for the remainder of the day. A responsible individual must stay with you for 24 hours following the procedure.  ?For the next 24 hours, DO NOT: ?-Drive a car ?-Paediatric nurse ?-Drink alcoholic beverages ?-Take any medication unless instructed by your physician ?-Make any legal decisions or sign important papers. ? ?Meals: ?Start with liquid foods such as gelatin or soup. Progress to regular foods as tolerated. Avoid greasy, spicy, heavy foods. If nausea and/or vomiting occur, drink only clear liquids until the nausea and/or vomiting subsides. Call your physician if vomiting continues. ? ?Special Instructions/Symptoms: ?Your throat may feel dry or sore from the anesthesia or the breathing tube placed in your throat during surgery. If this causes discomfort, gargle with warm salt water. The discomfort should disappear within 24 hours. ? ?If you had a scopolamine patch placed behind your ear for the management of post- operative nausea and/or vomiting: ? ?1. The medication in the patch is effective for 72 hours, after which it should be removed.  Wrap patch in a tissue and discard in the trash. Wash hands thoroughly with soap and water. ?2. You may remove the patch earlier than 72 hours if you experience unpleasant side effects which may include dry mouth, dizziness or visual disturbances. ?3. Avoid touching the patch. Wash your hands with soap and water after contact with the patch. ?    ? ? ? ?Next dose of Tylenol can be given after 12:33 PM. ? ?

## 2021-07-02 NOTE — Anesthesia Preprocedure Evaluation (Addendum)
Anesthesia Evaluation  ?Patient identified by MRN, date of birth, ID band ?Patient awake ? ? ? ?Reviewed: ?Allergy & Precautions, NPO status , Patient's Chart, lab work & pertinent test results ? ?History of Anesthesia Complications ?(+) history of anesthetic complications (hx panic attacks with general anesthesia emergence) ? ?Airway ?Mallampati: I ? ?TM Distance: >3 FB ?Neck ROM: Full ? ? ? Dental ? ?(+) Teeth Intact, Dental Advisory Given, Chipped,  ?  ?Pulmonary ?neg pulmonary ROS,  ?  ?Pulmonary exam normal ?breath sounds clear to auscultation ? ? ? ? ? ? Cardiovascular ?negative cardio ROS ?Normal cardiovascular exam ?Rhythm:Regular Rate:Normal ? ? ?  ?Neuro/Psych ? Headaches, PSYCHIATRIC DISORDERS (no longer taking anything for anxiety ) Anxiety Depression   ? GI/Hepatic ?Neg liver ROS, GERD  Medicated and Controlled,  ?Endo/Other  ?negative endocrine ROS ? Renal/GU ?negative Renal ROS  ?negative genitourinary ?  ?Musculoskeletal ?negative musculoskeletal ROS ?(+)  ? Abdominal ?  ?Peds ?negative pediatric ROS ?(+)  Hematology ?negative hematology ROS ?(+)   ?Anesthesia Other Findings ?L breast mass ? Reproductive/Obstetrics ?negative OB ROS ? ?  ? ? ? ? ? ? ? ? ? ? ? ? ? ?  ?  ? ? ? ? ? ? ? ?Anesthesia Physical ?Anesthesia Plan ? ?ASA: 1 ? ?Anesthesia Plan: General  ? ?Post-op Pain Management: Tylenol PO (pre-op)* and Toradol IV (intra-op)*  ? ?Induction: Intravenous ? ?PONV Risk Score and Plan: 3 and Ondansetron, Dexamethasone, Midazolam and Treatment may vary due to age or medical condition ? ?Airway Management Planned: LMA ? ?Additional Equipment: None ? ?Intra-op Plan:  ? ?Post-operative Plan: Extubation in OR ? ?Informed Consent: I have reviewed the patients History and Physical, chart, labs and discussed the procedure including the risks, benefits and alternatives for the proposed anesthesia with the patient or authorized representative who has indicated his/her  understanding and acceptance.  ? ? ? ?Dental advisory given ? ?Plan Discussed with: CRNA ? ?Anesthesia Plan Comments: (Very anxious in preop)  ? ? ? ? ? ?Anesthesia Quick Evaluation ? ?

## 2021-07-02 NOTE — Anesthesia Postprocedure Evaluation (Signed)
Anesthesia Post Note ? ?Patient: [name] ? ?Procedure(s) Performed: LEFT BREAST LUMPECTOMY (Left: Breast) ? ?  ? ?Patient location during evaluation: PACU ?Anesthesia Type: General ?Level of consciousness: awake and alert, oriented and patient cooperative ?Pain management: pain level controlled ?Vital Signs Assessment: post-procedure vital signs reviewed and stable ?Respiratory status: spontaneous breathing, nonlabored ventilation and respiratory function stable ?Cardiovascular status: blood pressure returned to baseline and stable ?Postop Assessment: no apparent nausea or vomiting ?Anesthetic complications: no ? ? ?No notable events documented. ? ?Last Vitals:  ?Vitals:  ? 07/02/21 0837 07/02/21 0858  ?BP: (!) 108/52 (!) 103/52  ?Pulse: 73 69  ?Resp: 13 16  ?Temp:  36.5 ?C  ?SpO2: 97% 99%  ?  ?Last Pain:  ?Vitals:  ? 07/02/21 0858  ?TempSrc:   ?PainSc: 0-No pain  ? ? ?  ?  ?  ?  ?  ?  ? ?[name] [name] ? ? ? ? ?

## 2021-07-02 NOTE — Transfer of Care (Signed)
Immediate Anesthesia Transfer of Care Note ? ?Patient: [name] ? ?Procedure(s) Performed: LEFT BREAST LUMPECTOMY (Left: Breast) ? ?Patient Location: PACU ? ?Anesthesia Type:General ? ?Level of Consciousness: sedated ? ?Airway & Oxygen Therapy: Patient Spontanous Breathing and Patient connected to face mask oxygen ? ?Post-op Assessment: Report given to RN and Post -op Vital signs reviewed and stable ? ?Post vital signs: Reviewed and stable ? ?Last Vitals:  ?Vitals Value Taken Time  ?BP 91/50 07/02/21 0757  ?Temp    ?Pulse 57 07/02/21 0759  ?Resp 10 07/02/21 0800  ?SpO2 99 % 07/02/21 0759  ?Vitals shown include unvalidated device data. ? ?Last Pain:  ?Vitals:  ? 07/02/21 0630  ?TempSrc: Oral  ?PainSc: 0-No pain  ?   ? ?  ? ?Complications: No notable events documented. ?

## 2021-07-02 NOTE — Anesthesia Procedure Notes (Signed)
Procedure Name: LMA Insertion ?Date/Time: 07/02/2021 7:33 AM ?Performed by: Maryella Shivers, CRNA ?Pre-anesthesia Checklist: Patient identified, Emergency Drugs available, Suction available and Patient being monitored ?Patient Re-evaluated:Patient Re-evaluated prior to induction ?Oxygen Delivery Method: Circle system utilized ?Preoxygenation: Pre-oxygenation with 100% oxygen ?Induction Type: IV induction ?Ventilation: Mask ventilation without difficulty ?LMA: LMA inserted ?LMA Size: 4.0 ?Number of attempts: 1 ?Airway Equipment and Method: Bite block ?Placement Confirmation: positive ETCO2 ?Tube secured with: Tape ?Dental Injury: Teeth and Oropharynx as per pre-operative assessment  ? ? ? ? ?

## 2021-07-02 NOTE — Interval H&P Note (Signed)
History and Physical Interval Note: no change in H and P ? ?07/02/2021 ?7:13 AM ? ?[name]  has presented today for surgery, with the diagnosis of LEFT BREAST MASS.  The various methods of treatment have been discussed with the patient and family. After consideration of risks, benefits and other options for treatment, the patient has consented to  Procedure(s): ?LEFT BREAST LUMPECTOMY (Left) as a surgical intervention.  The patient's history has been reviewed, patient examined, no change in status, stable for surgery.  I have reviewed the patient's chart and labs.  Questions were answered to the patient's satisfaction.   ? ? ?Coralie Keens ? ? ?

## 2021-07-03 LAB — SURGICAL PATHOLOGY

## 2021-07-19 ENCOUNTER — Other Ambulatory Visit: Payer: Self-pay | Admitting: Nurse Practitioner

## 2021-09-21 ENCOUNTER — Other Ambulatory Visit: Payer: Self-pay

## 2021-09-21 ENCOUNTER — Telehealth: Payer: Self-pay | Admitting: Nurse Practitioner

## 2021-09-21 MED ORDER — AMOXICILLIN-POT CLAVULANATE 875-125 MG PO TABS: 1.0000 | ORAL_TABLET | Freq: Two times a day (BID) | ORAL | 0 refills | Status: DC

## 2021-09-22 ENCOUNTER — Other Ambulatory Visit: Payer: Self-pay | Admitting: Nurse Practitioner

## 2021-10-19 ENCOUNTER — Encounter: Payer: Self-pay | Admitting: Gastroenterology

## 2021-10-19 ENCOUNTER — Ambulatory Visit: Payer: BC Managed Care – PPO | Admitting: Gastroenterology

## 2021-10-19 VITALS — BP 112/72 | HR 68 | Ht 63.0 in | Wt 142.1 lb

## 2021-10-19 DIAGNOSIS — K582 Mixed irritable bowel syndrome: Secondary | ICD-10-CM | POA: Diagnosis not present

## 2021-10-19 DIAGNOSIS — K6389 Other specified diseases of intestine: Secondary | ICD-10-CM | POA: Diagnosis not present

## 2021-10-19 DIAGNOSIS — E739 Lactose intolerance, unspecified: Secondary | ICD-10-CM | POA: Diagnosis not present

## 2021-10-19 DIAGNOSIS — R140 Abdominal distension (gaseous): Secondary | ICD-10-CM

## 2021-10-19 MED ORDER — LINACLOTIDE 290 MCG PO CAPS: ORAL_CAPSULE | ORAL | 3 refills | Status: DC

## 2021-10-19 MED ORDER — IBGARD 90 MG PO CPCR: 2.0000 | ORAL_CAPSULE | Freq: Every day | ORAL | 0 refills | Status: AC

## 2021-10-19 MED ORDER — RIFAXIMIN 550 MG PO TABS: 550.0000 mg | ORAL_TABLET | Freq: Three times a day (TID) | ORAL | 0 refills | Status: AC

## 2021-10-19 NOTE — Patient Instructions (Addendum)
Follow up with Dr Silverio Decamp on 12/23/21 at 9:30 am.  We have sent the following medications to your pharmacy for you to pick up at your convenience: Xifaxan 550 mg three times daily x 14 days Linzess 290 mcg daily   Please purchase the following medications over the counter and take as directed: IBGard- as directed  If you are age 57 or older, your body mass index should be between 23-30. Your Body mass index is 25.18 kg/m. If this is out of the aforementioned range listed, please consider follow up with your Primary Care Provider.  If you are age 23 or younger, your body mass index should be between 19-25. Your Body mass index is 25.18 kg/m. If this is out of the aformentioned range listed, please consider follow up with your Primary Care Provider.   ________________________________________________________  The [facility] providers would like to encourage you to use [facility] to communicate with providers for non-urgent requests or questions.  Due to long hold times on the telephone, sending your provider a message by [facility] may be a faster and more efficient way to get a response.  Please allow 48 business hours for a response.  Please remember that this is for non-urgent requests.  _______________________________________________________  Due to recent changes in healthcare laws, you may see the results of your imaging and laboratory studies on MyChart before your provider has had a chance to review them.  We understand that in some cases there may be results that are confusing or concerning to you. Not all laboratory results come back in the same time frame and the provider may be waiting for multiple results in order to interpret others.  Please give [place] 48 hours in order for your provider to thoroughly review all the results before contacting the office for clarification of your results.

## 2021-10-19 NOTE — Progress Notes (Signed)
[name]    [id number]    1965-09-20  Primary Care Physician:Shah, Weldon Picking, MD  Referring Physician: Monico Blitz, [address] [place],  [place] [address]   Chief complaint:  Abdominal pain  HPI:  55 year old very pleasant female here for follow-up visit for intermittent abdominal pain, abdominal bloating and constipation  She continues to have significant postprandial abdominal bloating.  She has noted some improvement with IBgard. She is s/p antibiotics for acute diverticulitis.  She is taking Linzess 290 mcg daily  Colonoscopy Jul 29, 2020 - One 4 mm polyp in the transverse colon, removed with a cold snare. Resected and retrieved. - Two 1 to 2 mm polyps in the transverse colon and in the ascending colon, removed with a cold biopsy forceps. Resected and retrieved. - Diverticulosis in the sigmoid colon and in the descending colon. - Internal hemorrhoids   CT abdomen pelvis with contrast October 24, 2018 No acute finding by CT. The patient does have sigmoid diverticulosis but there is no evidence of acute diverticulitis. No other acute or significant bowel finding. No solid organ pathology.  Outpatient Encounter Medications as of 10/19/2021  Medication Sig   aspirin-acetaminophen-caffeine (EXCEDRIN MIGRAINE) 621-308-65 MG per tablet Take 1 tablet by mouth as needed.   atorvastatin (LIPITOR) 20 MG tablet Take 20 mg by mouth daily.   Biotin 1000 MCG CHEW Chew by mouth.   cholecalciferol (VITAMIN D3) 25 MCG (1000 UNIT) tablet Take 1,000 Units by mouth daily.   LINZESS 290 MCG CAPS capsule TAKE 1 CAPSULE BY MOUTH EVERY DAY BEFORE BREAKFAST   Multiple Vitamin (MULTIVITAMIN) tablet Take 1 tablet by mouth daily.   omeprazole (PRILOSEC) 40 MG capsule Take 40 mg by mouth daily.   Peppermint Oil (IBGARD) 90 MG CPCR Take 2 capsules by mouth at bedtime.   Probiotic Product (ALIGN EXTRA STRENGTH PO) Take by mouth.   traMADol (ULTRAM) 50 MG tablet Take 1 tablet (50 mg  total) by mouth every 6 (six) hours as needed for severe pain.   vitamin C (ASCORBIC ACID) 500 MG tablet Take 500 mg by mouth daily.   [DISCONTINUED] amoxicillin-clavulanate (AUGMENTIN) 875-125 MG tablet Take 1 tablet by mouth 2 (two) times daily.   No facility-administered encounter medications on file as of 10/19/2021.    Allergies as of 10/19/2021 - Review Complete 10/19/2021  Allergen Reaction Noted   Morphine and related  02/22/2012   Ciprofloxacin Itching 03/28/2019   Codeine  02/22/2012   Depo-medrol [methylprednisolone sodium succ]  03/01/2012   Flagyl [metronidazole] Itching 03/28/2019    Past Medical History:  Diagnosis Date   Abdominal pain    Breast mass, left    Chest pain 01/02/2009   stress echo...abnormal   Colon polyp    Constipation    Depression    Diverticulitis    Diverticulosis    GERD (gastroesophageal reflux disease)    Hemorrhoid    History of panic attacks    HOH (hard of hearing)    left ear   Hyperventilation    IBS (irritable bowel syndrome)    Interstitial cystitis    Maxillary sinusitis    Migraines    Seasonal allergies    Urinary tract bacterial infections     Past Surgical History:  Procedure Laterality Date   BREAST LUMPECTOMY Left 07/02/2021   Procedure: LEFT BREAST LUMPECTOMY;  Surgeon: Coralie Keens, MD;  Location: [facility];  Service: General;  Laterality: Left;  COLONOSCOPY  07/29/2020   states has had 2018 for diverticulitis   LAPAROSCOPY     abd   SALPINGECTOMY     SPINAL FUSION     TUBAL LIGATION     VAGINAL HYSTERECTOMY      Family History  Problem Relation Age of Onset   Colon cancer Paternal 47        x2   COPD Father    Ovarian cancer Cousin        mat side   Colon cancer Cousin    Thyroid disease Mother    Leukemia Cousin    Diabetes Maternal Grandfather    Prostate cancer Paternal Uncle    Irritable bowel syndrome Son    Irritable bowel syndrome Daughter    Diabetes Daughter     Esophageal cancer Neg Hx    Rectal cancer Neg Hx    Stomach cancer Neg Hx     Social History   Socioeconomic History   Marital status: Married    Spouse name: Not on file   Number of children: 3   Years of education: Not on file   Highest education level: Not on file  Occupational History    Employer: [employer]  Tobacco Use   Smoking status: Never   Smokeless tobacco: Never  Vaping Use   Vaping Use: Never used  Substance and Sexual Activity   Alcohol use: No    Alcohol/week: 0.0 standard drinks of alcohol   Drug use: No   Sexual activity: Not Currently    Birth control/protection: Surgical  Other Topics Concern   Not on file  Social History Narrative   About 2 caffeinated beverages daily   Social Determinants of Health   Financial Resource Strain: Not on file  Food Insecurity: Not on file  Transportation Needs: Not on file  Physical Activity: Not on file  Stress: Not on file  Social Connections: Not on file  Intimate Partner Violence: Not on file      Review of systems: All other review of systems negative except as mentioned in the HPI.   Physical Exam: Vitals:   10/19/21 0934  BP: 112/72  Pulse: 68  SpO2: 98%   Body mass index is 25.18 kg/m. Gen:      No acute distress HEENT:  sclera anicteric Abd:      soft, non-tender; no palpable masses, no distension Ext:    No edema Neuro: alert and oriented x 3 Psych: normal mood and affect  Data Reviewed:  Reviewed labs, radiology imaging, old records and pertinent past GI work up   Assessment and Plan/Recommendations:  56 year old very pleasant female with complaints of intermittent abdominal discomfort, abdominal bloating alternating constipation and diarrhea  Her symptoms are consistent  for small intestinal bacterial overgrowth, IBS diarrhea Two 2-week course of Xifaxan 550 mg 3 times daily  IBS constipation and diarrhea: Use Linzess 290 mcg daily Increase dietary fiber Use  IBgard up to 3 times daily as needed IBS symptoms  GERD: Use omeprazole and antireflux measures  Return in 3 months  This visit required >40 minutes of patient care (this includes precharting, chart review, review of results, face-to-face time used for counseling as well as treatment plan and follow-up. The patient was provided an opportunity to ask questions and all were answered. The patient agreed with the plan and demonstrated an understanding of the instructions.  Damaris Hippo , MD    CC: Monico Blitz, MD

## 2021-10-26 ENCOUNTER — Encounter: Payer: Self-pay | Admitting: Gastroenterology

## 2021-12-23 ENCOUNTER — Ambulatory Visit: Payer: BC Managed Care – PPO | Admitting: Gastroenterology

## 2022-01-25 ENCOUNTER — Telehealth: Payer: Self-pay | Admitting: Gastroenterology

## 2022-01-25 NOTE — Telephone Encounter (Signed)
Called the patient back to discuss. No answer. Left her a voicemail.

## 2022-01-25 NOTE — Telephone Encounter (Signed)
Patient called requesting antibiotics for a Diverticulitis flare up.

## 2022-01-27 NOTE — Telephone Encounter (Signed)
Inbound call from patient returning call. Please advise.  Thank you  

## 2022-08-27 ENCOUNTER — Other Ambulatory Visit: Payer: Self-pay

## 2022-08-27 ENCOUNTER — Telehealth: Payer: Self-pay | Admitting: Gastroenterology

## 2022-08-27 MED ORDER — AMOXICILLIN-POT CLAVULANATE 875-125 MG PO TABS: 1.0000 | ORAL_TABLET | Freq: Two times a day (BID) | ORAL | 0 refills | Status: AC

## 2022-08-27 NOTE — Telephone Encounter (Signed)
Spoke with the patient. She does feel she has diverticulitis and would benefit from antibiotics. She will purge with Miralax and begin Augmenting 875-125 mg tablets BID for 4 days. Soft diet with encouraged fluid intake. ED if fever or worsening pain. Up date by phone next week.

## 2022-08-27 NOTE — Telephone Encounter (Signed)
Doctor of the Day Patient of Dr Valinda Hoar with patient. History of diverticulitis. Last treated a year ago.  She reports 3 days of abdominal distention, constipation and soreness mid-abdominal area migrating to left lower abdominal. Some nausea. No vomiting. No fever.  Allergic to: Morphine And Codeine Ciprofloxacin Codeine Depo-medrol Flagyl [facility]

## 2022-08-27 NOTE — Telephone Encounter (Signed)
PT called to request an antibiotic for a diverticulits flare. Please advise.

## 2022-08-31 NOTE — Telephone Encounter (Signed)
Called the patient to check on her progress. No answer. Got her voicemail Left her a message of my call. Asked if she was continuing to have issues to please call. I will arrange for an appointment.

## 2022-11-08 NOTE — Progress Notes (Unsigned)
Chief Complaint: Primary GI MD: Dr. Lavon Paganini  HPI: 57 year old female medical history as listed below including diverticulitis and IBS-C/D presents for evaluation of  Last seen 09/2021 by Dr. Nathaniel Man.  At that time she was having postprandial abdominal bloating with some improvement on IBgard.  She is on Linzess 290 mcg daily.   Recently felt like she was having a flareup of diverticulitis 08/27/2022.  Was given Augmentin x 4 days.     PREVIOUS GI WORKUP   colonoscopy Jul 29, 2020 - One 4 mm polyp in the transverse colon, removed with a cold snare. Resected and retrieved. - Two 1 to 2 mm polyps in the transverse colon and in the ascending colon, removed with a cold biopsy forceps. Resected and retrieved. - Diverticulosis in the sigmoid colon and in the descending colon. - Internal hemorrhoids     CT abdomen pelvis with contrast October 24, 2018 No acute finding by CT. The patient does have sigmoid diverticulosis but there is no evidence of acute diverticulitis. No other acute or significant bowel finding. No solid organ pathology.  Past Medical History:  Diagnosis Date   Abdominal pain    Breast mass, left    Chest pain 01/02/2009   stress echo...abnormal   Colon polyp    Constipation    Depression    Diverticulitis    Diverticulosis    GERD (gastroesophageal reflux disease)    Hemorrhoid    History of panic attacks    HOH (hard of hearing)    left ear   Hyperventilation    IBS (irritable bowel syndrome)    Interstitial cystitis    Maxillary sinusitis    Migraines    Seasonal allergies    Urinary tract bacterial infections     Past Surgical History:  Procedure Laterality Date   BREAST LUMPECTOMY Left 07/02/2021   Procedure: LEFT BREAST LUMPECTOMY;  Surgeon: Abigail Miyamoto, MD;  Location: [facility];  Service: General;  Laterality: Left;   COLONOSCOPY  07/29/2020   states has had 2018 for diverticulitis   LAPAROSCOPY     abd    SALPINGECTOMY     SPINAL FUSION     TUBAL LIGATION     VAGINAL HYSTERECTOMY      Current Outpatient Medications  Medication Sig Dispense Refill   aspirin-acetaminophen-caffeine (EXCEDRIN MIGRAINE) 250-250-65 MG per tablet Take 1 tablet by mouth as needed.     atorvastatin (LIPITOR) 20 MG tablet Take 20 mg by mouth daily.     Biotin 1000 MCG CHEW Chew by mouth.     cholecalciferol (VITAMIN D3) 25 MCG (1000 UNIT) tablet Take 1,000 Units by mouth daily.     linaclotide (LINZESS) 290 MCG CAPS capsule TAKE 1 CAPSULE BY MOUTH EVERY DAY BEFORE BREAKFAST 90 capsule 3   Multiple Vitamin (MULTIVITAMIN) tablet Take 1 tablet by mouth daily.     omeprazole (PRILOSEC) 40 MG capsule Take 40 mg by mouth daily.     Peppermint Oil (IBGARD) 90 MG CPCR Take 2 capsules by mouth at bedtime. 1 capsule 0   Probiotic Product (ALIGN EXTRA STRENGTH PO) Take by mouth.     rifaximin (XIFAXAN) 550 MG TABS tablet Take 1 tablet (550 mg total) by mouth 3 (three) times daily. 42 tablet 0   traMADol (ULTRAM) 50 MG tablet Take 1 tablet (50 mg total) by mouth every 6 (six) hours as needed for severe pain. 15 tablet 0   vitamin C (ASCORBIC ACID) 500 MG tablet Take 500 mg  by mouth daily.     No current facility-administered medications for this visit.    Allergies as of 11/09/2022 - Review Complete 10/26/2021  Allergen Reaction Noted   Morphine and codeine  02/22/2012   Ciprofloxacin Itching 03/28/2019   Codeine  02/22/2012   Depo-medrol [methylprednisolone sodium succ]  03/01/2012   Flagyl [metronidazole] Itching 03/28/2019    Family History  Problem Relation Age of Onset   Colon cancer Paternal Aunt        x2   COPD Father    Ovarian cancer Cousin        mat side   Colon cancer Cousin    Thyroid disease Mother    Leukemia Cousin    Diabetes Maternal Grandfather    Prostate cancer Paternal Uncle    Irritable bowel syndrome Son    Irritable bowel syndrome Daughter    Diabetes Daughter    Esophageal cancer  Neg Hx    Rectal cancer Neg Hx    Stomach cancer Neg Hx     Social History   Socioeconomic History   Marital status: Married    Spouse name: Not on file   Number of children: 3   Years of education: Not on file   Highest education level: Not on file  Occupational History    Employer: [employer]  Tobacco Use   Smoking status: Never   Smokeless tobacco: Never  Vaping Use   Vaping status: Never Used  Substance and Sexual Activity   Alcohol use: No    Alcohol/week: 0.0 standard drinks of alcohol   Drug use: No   Sexual activity: Not Currently    Birth control/protection: Surgical  Other Topics Concern   Not on file  Social History Narrative   About 2 caffeinated beverages daily   Social Determinants of Health   Financial Resource Strain: Not on file  Food Insecurity: Not on file  Transportation Needs: Not on file  Physical Activity: Not on file  Stress: Not on file  Social Connections: Not on file  Intimate Partner Violence: Not on file    Review of Systems:    Constitutional: No weight loss, fever, chills, weakness or fatigue HEENT: Eyes: No change in vision               Ears, Nose, Throat:  No change in hearing or congestion Skin: No rash or itching Cardiovascular: No chest pain, chest pressure or palpitations   Respiratory: No SOB or cough Gastrointestinal: See HPI and otherwise negative Genitourinary: No dysuria or change in urinary frequency Neurological: No headache, dizziness or syncope Musculoskeletal: No new muscle or joint pain Hematologic: No bleeding or bruising Psychiatric: No history of depression or anxiety    Physical Exam:  Vital signs: There were no vitals taken for this visit.  Constitutional: NAD, Well developed, Well nourished, alert and cooperative Head:  Normocephalic and atraumatic. Eyes:   PEERL, EOMI. No icterus. Conjunctiva pink. Respiratory: Respirations even and unlabored. Lungs clear to auscultation bilaterally.    No wheezes, crackles, or rhonchi.  Cardiovascular:  Regular rate and rhythm. No peripheral edema, cyanosis or pallor.  Gastrointestinal:  Soft, nondistended, nontender. No rebound or guarding. Normal bowel sounds. No appreciable masses or hepatomegaly. Rectal:  Not performed.  Msk:  Symmetrical without gross deformities. Without edema, no deformity or joint abnormality.  Neurologic:  Alert and  oriented x4;  grossly normal neurologically.  Skin:   Dry and intact without significant lesions or rashes. Psychiatric: Oriented to person, place  and time. Demonstrates good judgement and reason without abnormal affect or behaviors.   RELEVANT LABS AND IMAGING: CBC    Component Value Date/Time   WBC 5.5 03/28/2019 1643   RBC 4.20 03/28/2019 1643   HGB 13.6 03/28/2019 1643   HCT 39.8 03/28/2019 1643   PLT 240.0 03/28/2019 1643   MCV 94.8 03/28/2019 1643   MCH 33.2 01/09/2010 1134   MCHC 34.2 03/28/2019 1643   RDW 13.7 03/28/2019 1643   LYMPHSABS 2.3 03/28/2019 1643   MONOABS 0.5 03/28/2019 1643   EOSABS 0.1 03/28/2019 1643   BASOSABS 0.0 03/28/2019 1643    CMP     Component Value Date/Time   NA 139 03/28/2019 1643   K 3.7 03/28/2019 1643   CL 103 03/28/2019 1643   CO2 29 03/28/2019 1643   GLUCOSE 94 03/28/2019 1643   BUN 15 03/28/2019 1643   CREATININE 0.87 03/28/2019 1643   CALCIUM 9.4 03/28/2019 1643   PROT 6.7 03/28/2019 1643   ALBUMIN 4.2 03/28/2019 1643   AST 22 03/28/2019 1643   ALT 18 03/28/2019 1643   ALKPHOS 77 03/28/2019 1643   BILITOT 0.3 03/28/2019 1643   GFRNONAA 73 10/23/2007 0956   GFRAA 88 10/23/2007 0956     Assessment/Plan:       Lara Mulch [facility] 11/08/2022, 12:28 PM  Cc: Kirstie Peri, MD

## 2022-11-09 ENCOUNTER — Ambulatory Visit: Payer: BC Managed Care – PPO | Admitting: Gastroenterology

## 2022-11-09 ENCOUNTER — Encounter: Payer: Self-pay | Admitting: Gastroenterology

## 2022-11-09 VITALS — BP 120/88 | HR 65 | Ht 63.0 in | Wt 135.4 lb

## 2022-11-09 DIAGNOSIS — K638219 Small intestinal bacterial overgrowth, unspecified: Secondary | ICD-10-CM

## 2022-11-09 DIAGNOSIS — K5909 Other constipation: Secondary | ICD-10-CM | POA: Diagnosis not present

## 2022-11-09 MED ORDER — TRULANCE 3 MG PO TABS: 3.0000 mg | ORAL_TABLET | Freq: Every day | ORAL | 6 refills | Status: DC

## 2022-11-09 NOTE — Patient Instructions (Addendum)
We have sent the following medications to your pharmacy for you to pick up at your convenience: TRULANCE   _______________________________________________________  If your blood pressure at your visit was 140/90 or greater, please contact your primary care physician to follow up on this.  _______________________________________________________  If you are age 58 or older, your body mass index should be between 23-30. Your Body mass index is 23.99 kg/m. If this is out of the aforementioned range listed, please consider follow up with your Primary Care Provider.  If you are age 60 or younger, your body mass index should be between 19-25. Your Body mass index is 23.99 kg/m. If this is out of the aformentioned range listed, please consider follow up with your Primary Care Provider.   ________________________________________________________  The [place] [facility] providers would like to encourage you to use [facility] to communicate with providers for non-urgent requests or questions.  Due to long hold times on the telephone, sending your provider a message by [facility] may be a faster and more efficient way to get a response.  Please allow 48 business hours for a response.  Please remember that this is for non-urgent requests.  _______________________________________________________   I appreciate the  opportunity to care for you  Thank You   Bayley [facility]

## 2022-12-06 ENCOUNTER — Other Ambulatory Visit: Payer: Self-pay | Admitting: Nurse Practitioner

## 2023-01-24 ENCOUNTER — Other Ambulatory Visit: Payer: Self-pay | Admitting: Nurse Practitioner

## 2023-02-09 ENCOUNTER — Telehealth: Payer: Self-pay | Admitting: Internal Medicine

## 2023-02-09 MED ORDER — LINACLOTIDE 290 MCG PO CAPS: ORAL_CAPSULE | ORAL | 1 refills | Status: DC

## 2023-02-09 NOTE — Telephone Encounter (Signed)
Patient indicates that Trulance is not cost effective for her. There is mention that we may try Amitiza if Trulance was ineffective. Would it be appropriate to go ahead and try for Amitiza to see if insurance would approve that medication at a lower cost?

## 2023-02-09 NOTE — Telephone Encounter (Signed)
Inbound call from patient stating that she can not afford Trulance and is requesting a refill for Linzess. Please advise.

## 2023-02-09 NOTE — Telephone Encounter (Signed)
Discussed with patient that we will send amitiza to her pharmacy to see if this would be more cost effective. Patient states she has actually decided that she would like to just stick with the previous linzess prescription as she has now gotten it "regulated" and is having normal bowel movements. Will send refills.

## 2023-02-10 ENCOUNTER — Ambulatory Visit: Payer: BC Managed Care – PPO | Admitting: Physician Assistant

## 2023-09-14 ENCOUNTER — Other Ambulatory Visit (HOSPITAL_COMMUNITY): Payer: Self-pay

## 2023-09-14 ENCOUNTER — Telehealth: Payer: Self-pay

## 2023-09-14 NOTE — Telephone Encounter (Signed)
 Pharmacy Patient Advocate Encounter   Received notification from CoverMyMeds that prior authorization for Trulance  3MG  tablets is required/requested.   Insurance verification completed.   The patient is insured through [facility] .   Per test claim: PA required; PA submitted to above mentioned insurance via CoverMyMeds Key/confirmation #/EOC [facility] Status is pending

## 2023-09-15 NOTE — Telephone Encounter (Signed)
 Pharmacy Patient Advocate Encounter  Received notification from [facility] that Prior Authorization for Trulance  3MG  tablets has been APPROVED from 09-14-2023 to 09-14-2026   PA #/Case ID/Reference #: [id number]

## 2023-09-29 ENCOUNTER — Other Ambulatory Visit: Payer: Self-pay

## 2023-09-29 ENCOUNTER — Telehealth: Payer: Self-pay | Admitting: Gastroenterology

## 2023-09-29 MED ORDER — AMOXICILLIN-POT CLAVULANATE 875-125 MG PO TABS: 1.0000 | ORAL_TABLET | Freq: Two times a day (BID) | ORAL | 0 refills | Status: AC

## 2023-09-29 NOTE — Telephone Encounter (Signed)
 She has sigmoid diverticulosis, her symptoms do not appear to be consistent with acute diverticulitis but cannot exclude it.  Will do a short course of Augmentin  875 mg twice daily for 7 days.  Please advise patient to continue Linzess  and add MiraLAX if needed, if she continues to feel constipated.  Also advise patient to call us  back next week, if continues to have persistent symptoms will schedule office follow-up visit for evaluation and further management

## 2023-09-29 NOTE — Telephone Encounter (Signed)
Patient advised. She agrees to this plan of care.

## 2023-09-29 NOTE — Telephone Encounter (Signed)
 Inbound call from patient stating she is having a diverticulitis flare up and would like [facility] [place] on [address]. Patient is also requesting a refill for Trulance . Patient is Dr. Shila patient and will be routed accordingly. Please advise, thank you

## 2023-09-29 NOTE — Telephone Encounter (Signed)
 Patient reports pain in the lower abdomen x 2 days. The pain in the lower abdomen worsens when she walks. She is able to get comfortable at times, but the pain returns. She has had liquid bowel movements but feels constipated and bloated. She is on Linzess  290 mcg and takes this daily. (Trulance  is too expensive) She is hoping for antibiotics. [facility] [place].

## 2023-10-27 ENCOUNTER — Telehealth: Payer: Self-pay | Admitting: Gastroenterology

## 2023-10-27 MED ORDER — TRULANCE 3 MG PO TABS: 3.0000 mg | ORAL_TABLET | Freq: Every day | ORAL | 3 refills | Status: DC

## 2023-10-27 NOTE — Telephone Encounter (Signed)
 Received call from patient regarding medication refill for Trulance  to be [facility] to [facility] [place]. Please advise, Thank you.

## 2023-10-27 NOTE — Telephone Encounter (Signed)
 Sent Trulance  3 mg once daily # 30, 3 refills to [facility] [place], looks like it was approved in June 2025 by the patients insurance

## 2023-12-12 ENCOUNTER — Other Ambulatory Visit: Payer: Self-pay | Admitting: Gastroenterology

## 2023-12-30 IMAGING — MG DIGITAL DIAGNOSTIC BILAT W/ TOMO W/ CAD
6 of 10 series · 6 of 30 positions shown · non-contrast
Comparison: Previous exam(s).

CLINICAL DATA: 55-year-old female presenting with a new lump in the
left breast for approximately 3 months.

EXAM:
DIGITAL DIAGNOSTIC BILATERAL MAMMOGRAM WITH TOMOSYNTHESIS AND CAD;
ULTRASOUND LEFT BREAST LIMITED
TECHNIQUE: Bilateral digital diagnostic mammography and breast tomosynthesis
was performed. The images were evaluated with computer-aided
detection.; Targeted ultrasound examination of the left breast was
performed.

[L MLO synth-2D (1 of 2)]
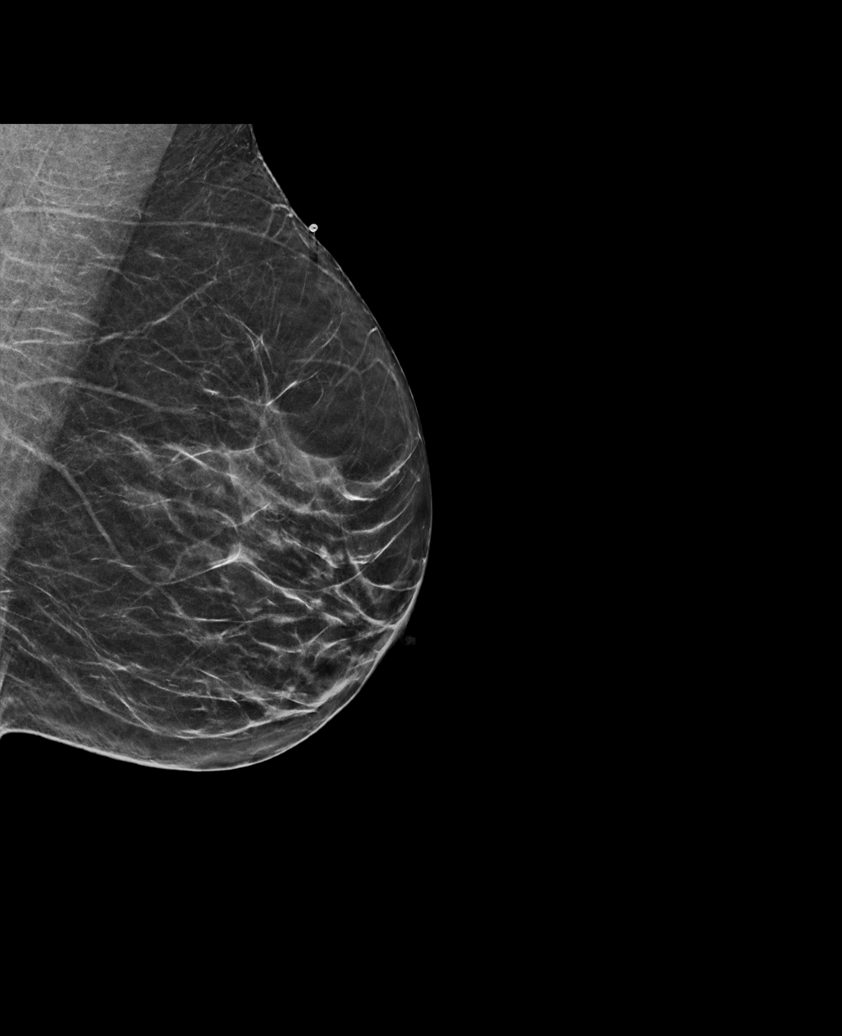

[R MLO synth-2D]
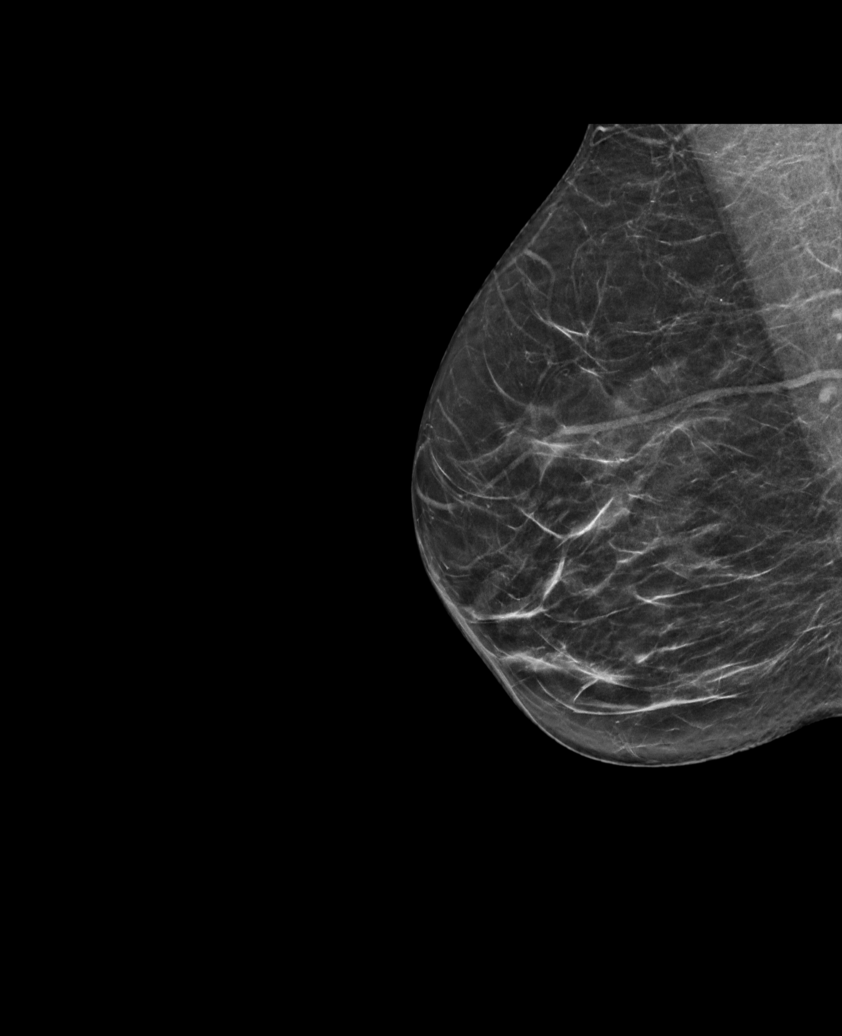

[R CC synth-2D]
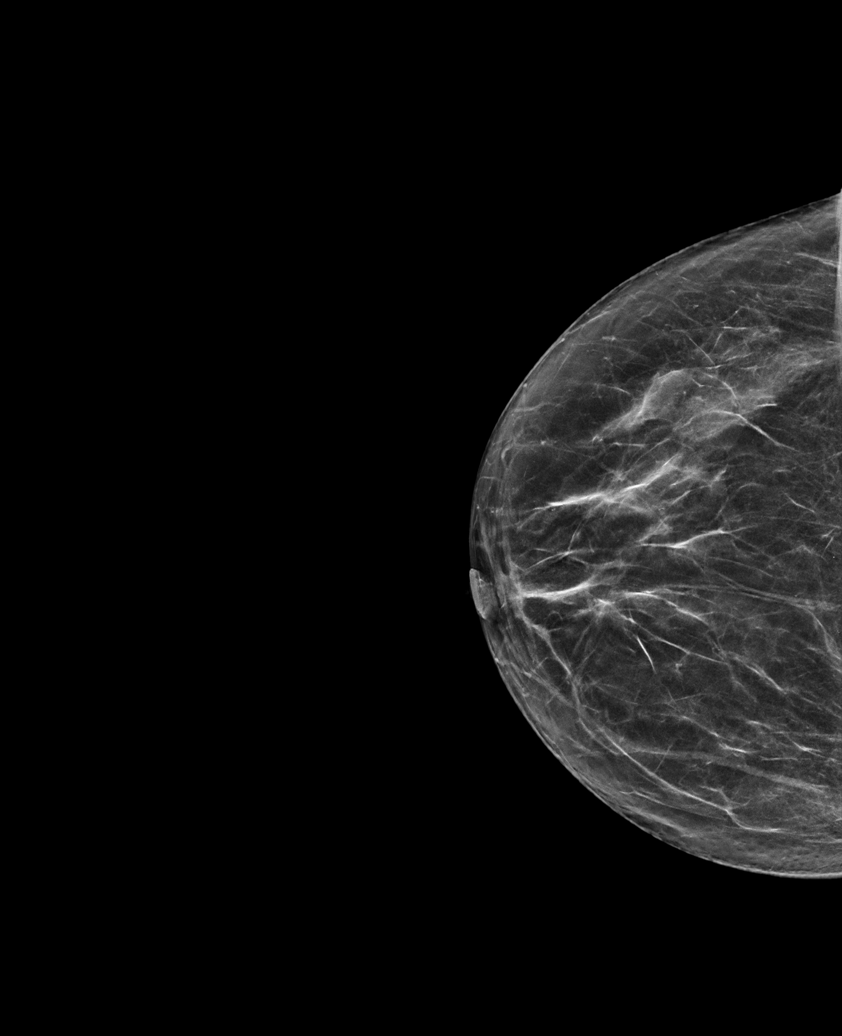

[L CC synth-2D]
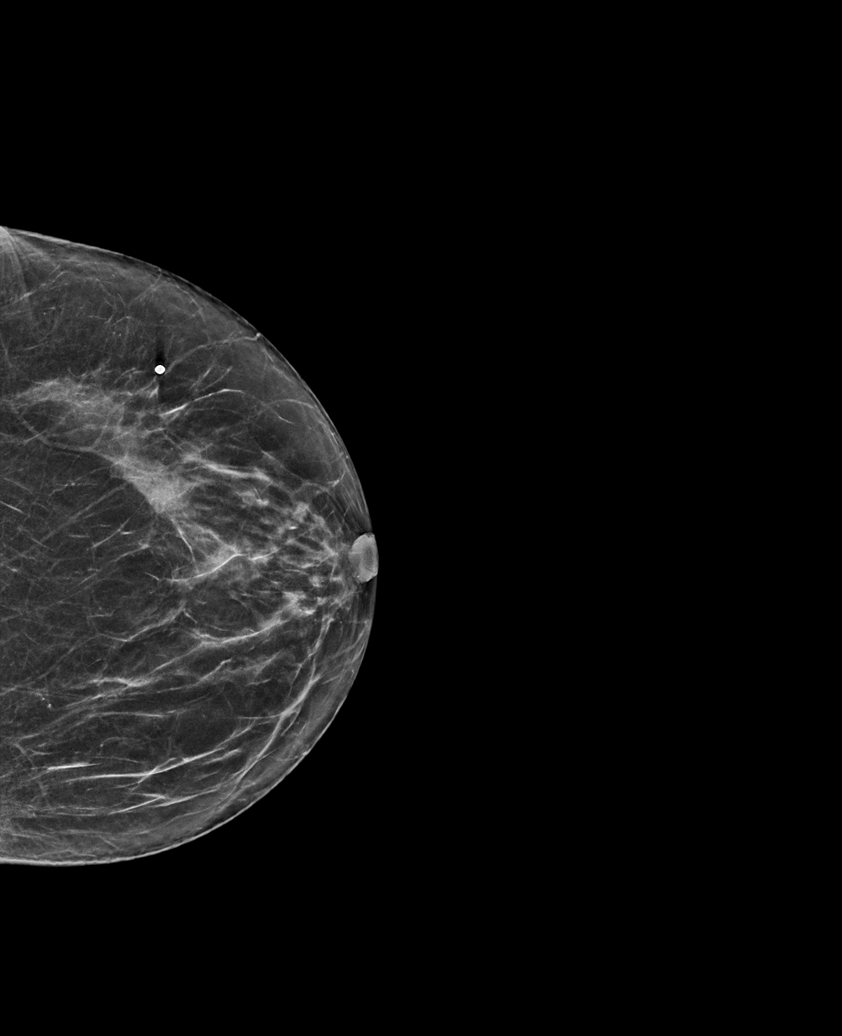

[L MLO synth-2D (2 of 2)]
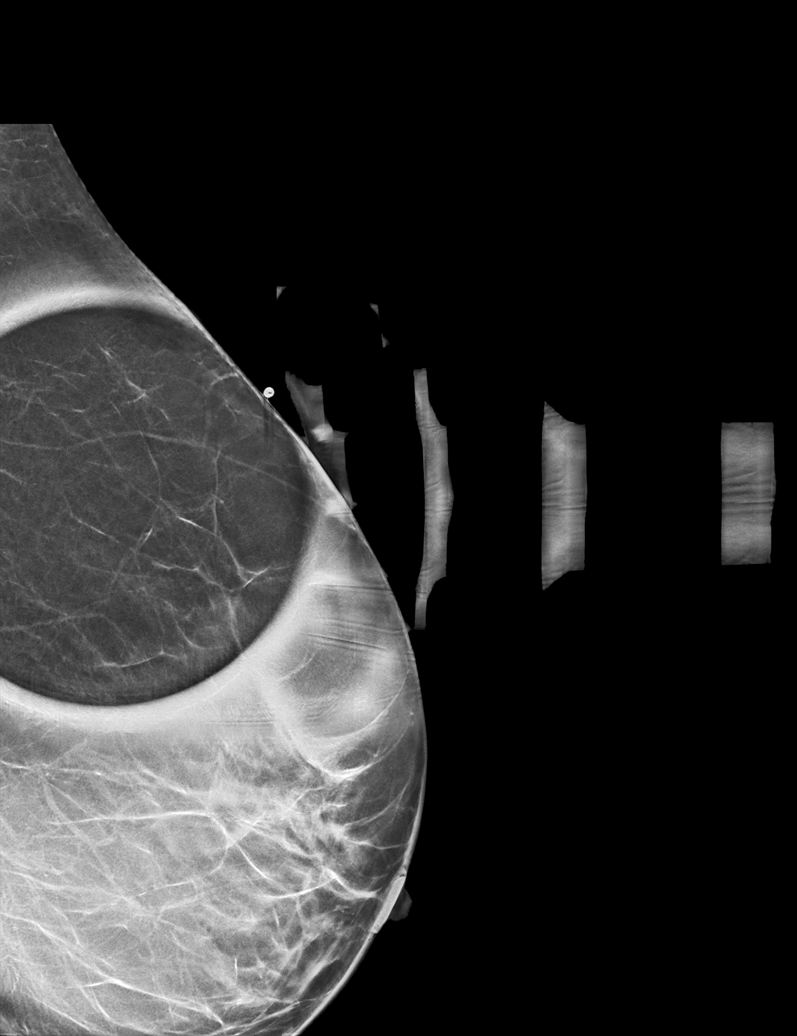

[L MLO tomo · tomo slice 30/59.0]
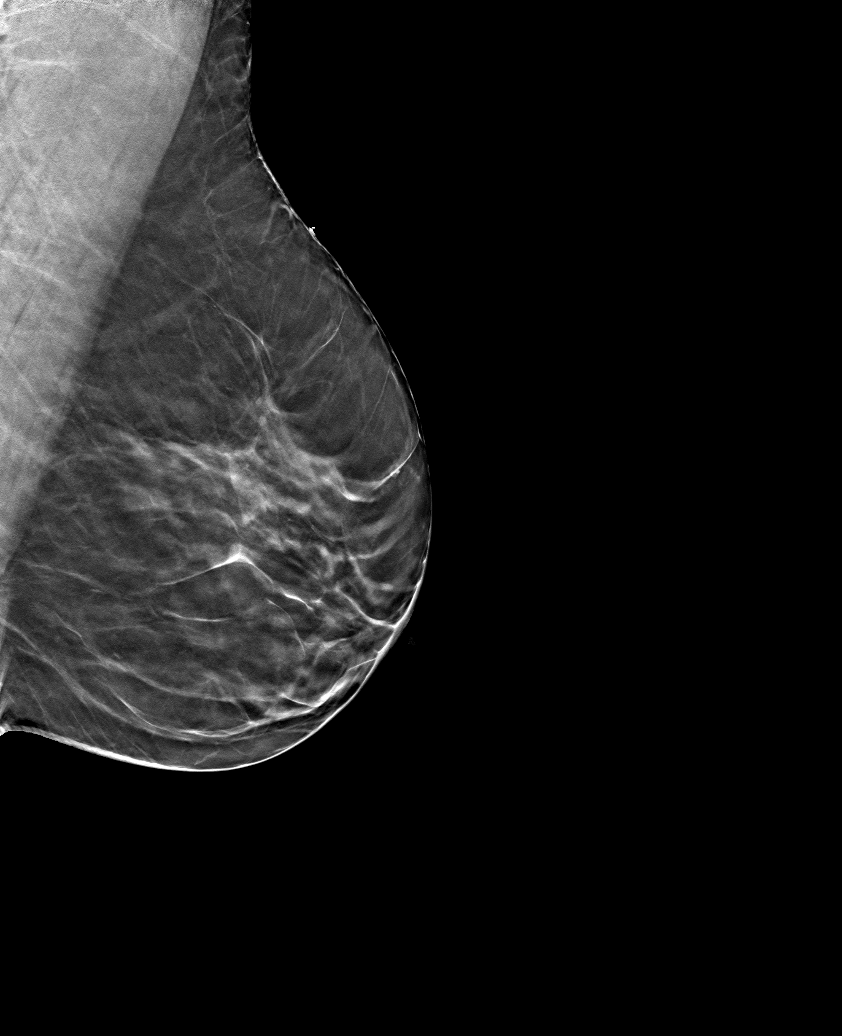

[6 of 30 positions shown; findings below may reference images not displayed]

ACR Breast Density Category b: There are scattered areas of
fibroglandular density.
FINDINGS: Mammogram:

Right breast: No suspicious mass, distortion, or microcalcifications
are identified to suggest presence of malignancy.

Left breast: A skin BB marks the palpable site of concern reported
by the patient in the upper-outer quadrant of the left breast. A
spot tangential view of this area was performed in addition to
standard views. There is no new abnormality at the palpable site or
elsewhere in the left breast suggest presence of malignancy.

On physical exam at the site of concern reported by the patient in
the upper-outer left breast I do not feel a fixed discrete mass.
There is what feels like a ridge of tissue.

Ultrasound:

Targeted ultrasound is performed at the palpable site of concern
reported by the patient in the left breast at 1 o'clock 8 cm from
the nipple demonstrating no cystic or solid mass.
IMPRESSION: 1. No mammographic or sonographic evidence of malignancy at the
palpable site of concern in the left breast.

2.  No mammographic evidence of malignancy in the right breast.

RECOMMENDATION:
1. Recommend any further workup of the palpable site in the left
breast be on a clinical basis.

2.  Screening mammogram in one year.(Code:[id number])

I have discussed the findings and recommendations with the patient.
If applicable, a reminder letter will be sent to the patient
regarding the next appointment.

BI-RADS CATEGORY  1: Negative.

## 2024-04-02 ENCOUNTER — Telehealth: Payer: Self-pay | Admitting: Internal Medicine

## 2024-04-02 NOTE — Telephone Encounter (Signed)
 Attempted to return call. No answer. Voicemail is full. Patient last treated by us  for diverticulitis in May of 2024. Last seen in office 10/2022. Linzess  last filled 02/09/2023.

## 2024-04-02 NOTE — Telephone Encounter (Signed)
 Call from pt regarding rx refill. Pt stated Trulance  is too expensive costing $200 and is requesting Linzess .  Pt also asked for antibiotics for diverticulitis. Please advise thank you

## 2024-04-05 MED ORDER — LINACLOTIDE 290 MCG PO CAPS: ORAL_CAPSULE | ORAL | 1 refills | Status: AC

## 2024-04-05 NOTE — Telephone Encounter (Signed)
 Patient returned phone call. Please advise, thank you

## 2024-04-05 NOTE — Telephone Encounter (Signed)
 Spoke with the patient. She cannot afford the co-pay for Trulance . Patient requests Linzess . [facility] [place]. Patient feels she may have diverticulitis due to her constipation. Appointment scheduled to fit her schedule. Agrees to go to an urgent care for evaluation.

## 2024-04-30 ENCOUNTER — Ambulatory Visit: Payer: Self-pay | Admitting: Physician Assistant

## 2024-05-24 ENCOUNTER — Ambulatory Visit: Payer: Self-pay | Admitting: Physician Assistant
